# Patient Record
Sex: Female | Born: 1950
Health system: Southern US, Community
[De-identification: ages and names within clinical notes are randomized; demographics above are authoritative.]

## PROBLEM LIST (undated history)

## (undated) DIAGNOSIS — E039 Hypothyroidism, unspecified: Secondary | ICD-10-CM

## (undated) DIAGNOSIS — M1990 Unspecified osteoarthritis, unspecified site: Secondary | ICD-10-CM

## (undated) DIAGNOSIS — R519 Headache, unspecified: Secondary | ICD-10-CM

## (undated) DIAGNOSIS — R51 Headache: Secondary | ICD-10-CM

## (undated) DIAGNOSIS — E785 Hyperlipidemia, unspecified: Secondary | ICD-10-CM

## (undated) DIAGNOSIS — K5792 Diverticulitis of intestine, part unspecified, without perforation or abscess without bleeding: Secondary | ICD-10-CM

## (undated) DIAGNOSIS — F419 Anxiety disorder, unspecified: Secondary | ICD-10-CM

## (undated) DIAGNOSIS — E079 Disorder of thyroid, unspecified: Secondary | ICD-10-CM

## (undated) HISTORY — DX: Disorder of thyroid, unspecified: E07.9

## (undated) HISTORY — DX: Hyperlipidemia, unspecified: E78.5

## (undated) HISTORY — DX: Diverticulitis of intestine, part unspecified, without perforation or abscess without bleeding: K57.92

## (undated) HISTORY — PX: APPENDECTOMY: SHX54

## (undated) HISTORY — PX: CHOLECYSTECTOMY: SHX55

## (undated) HISTORY — PX: VARICOSE VEIN SURGERY: SHX832

## (undated) HISTORY — PX: TUBAL LIGATION: SHX77

---

## 1998-11-02 ENCOUNTER — Other Ambulatory Visit: Admission: RE | Admit: 1998-11-02 | Discharge: 1998-11-02 | Payer: Self-pay | Admitting: Obstetrics and Gynecology

## 2002-01-28 ENCOUNTER — Other Ambulatory Visit: Admission: RE | Admit: 2002-01-28 | Discharge: 2002-01-28 | Payer: Self-pay | Admitting: Obstetrics and Gynecology

## 2003-02-20 ENCOUNTER — Other Ambulatory Visit: Admission: RE | Admit: 2003-02-20 | Discharge: 2003-02-20 | Payer: Self-pay | Admitting: Obstetrics and Gynecology

## 2004-04-02 ENCOUNTER — Other Ambulatory Visit: Admission: RE | Admit: 2004-04-02 | Discharge: 2004-04-02 | Payer: Self-pay | Admitting: Obstetrics and Gynecology

## 2005-05-12 ENCOUNTER — Other Ambulatory Visit: Admission: RE | Admit: 2005-05-12 | Discharge: 2005-05-12 | Payer: Self-pay | Admitting: Obstetrics and Gynecology

## 2009-02-13 ENCOUNTER — Encounter: Admission: RE | Admit: 2009-02-13 | Discharge: 2009-02-13 | Payer: Self-pay | Admitting: Family Medicine

## 2009-10-23 ENCOUNTER — Encounter: Admission: RE | Admit: 2009-10-23 | Discharge: 2009-10-23 | Payer: Self-pay | Admitting: Obstetrics and Gynecology

## 2010-05-16 ENCOUNTER — Encounter: Payer: Self-pay | Admitting: Obstetrics and Gynecology

## 2012-11-05 ENCOUNTER — Encounter: Payer: Self-pay | Admitting: Internal Medicine

## 2012-12-09 ENCOUNTER — Encounter: Payer: Self-pay | Admitting: Internal Medicine

## 2012-12-27 ENCOUNTER — Encounter: Payer: Self-pay | Admitting: Internal Medicine

## 2013-01-02 ENCOUNTER — Encounter: Payer: Self-pay | Admitting: Internal Medicine

## 2013-02-06 ENCOUNTER — Ambulatory Visit (AMBULATORY_SURGERY_CENTER): Payer: Self-pay | Admitting: *Deleted

## 2013-02-06 VITALS — Ht 61.0 in | Wt 118.0 lb

## 2013-02-06 DIAGNOSIS — Z1211 Encounter for screening for malignant neoplasm of colon: Secondary | ICD-10-CM

## 2013-02-06 MED ORDER — MOVIPREP 100 G PO SOLR: ORAL | Status: DC

## 2013-02-06 NOTE — Progress Notes (Signed)
Patient denies any allergies to eggs or soy. Patient denies any problems with anesthesia.  

## 2013-02-08 ENCOUNTER — Encounter: Payer: Self-pay | Admitting: Internal Medicine

## 2013-03-08 ENCOUNTER — Encounter: Payer: Self-pay | Admitting: Internal Medicine

## 2013-03-08 ENCOUNTER — Ambulatory Visit (AMBULATORY_SURGERY_CENTER): Payer: No Typology Code available for payment source | Admitting: Internal Medicine

## 2013-03-08 VITALS — BP 109/50 | HR 61 | Temp 98.2°F | Resp 17 | Ht 61.0 in | Wt 118.0 lb

## 2013-03-08 DIAGNOSIS — Z1211 Encounter for screening for malignant neoplasm of colon: Secondary | ICD-10-CM

## 2013-03-08 LAB — HM COLONOSCOPY

## 2013-03-08 MED ORDER — SODIUM CHLORIDE 0.9 % IV SOLN: 500.0000 mL | INTRAVENOUS | Status: DC

## 2013-03-08 NOTE — Op Note (Signed)
[facility] [address] [place], [address]   COLONOSCOPY PROCEDURE REPORT  PATIENT: Amanda Solis, Amanda Solis  MR#: [id number] BIRTHDATE: [birth year] , 62  yrs. old GENDER: Female ENDOSCOPIST: Hart Carwin, MD REFERRED IO:NGEXBM Nicholos Johns, M.D., Dr Richardean Chimera PROCEDURE DATE:  03/08/2013 PROCEDURE:   Colonoscopy, screening First Screening Colonoscopy - Avg.  risk and is 50 yrs.  old or older - No.  Prior Negative Screening - Now for repeat screening. 10 or more years since last screening  History of Adenoma - Now for follow-up colonoscopy & has been > or = to 3 yrs.  N/A  Polyps Removed Today? No.  Recommend repeat exam, <10 yrs? No. ASA CLASS:   Class II INDICATIONS:Average risk patient for colon cancer and last colonoscopy November 2004 was normal. MEDICATIONS: MAC sedation, administered by CRNA and propofol (Diprivan) 200mg  IV  DESCRIPTION OF PROCEDURE:   After the risks benefits and alternatives of the procedure were thoroughly explained, informed consent was obtained.  A digital rectal exam revealed no abnormalities of the rectum.   The LB [id number]  endoscope was introduced through the anus and advanced to the cecum, which was identified by both the appendix and ileocecal valve. No adverse events experienced.   The quality of the prep was Prepopik excellent  The instrument was then slowly withdrawn as the colon was fully examined.      COLON FINDINGS: Mild diverticulosis was noted in the sigmoid colon. Retroflexed views revealed no abnormalities. The time to cecum=7 minutes 35 seconds.  Withdrawal time=6 minutes 54 seconds.  The scope was withdrawn and the procedure completed. COMPLICATIONS: There were no complications.  ENDOSCOPIC IMPRESSION: Mild diverticulosis was noted in the sigmoid colon  RECOMMENDATIONS: 1.  High fiber diet 2.   recall colonoscopy in 10 years   eSigned:  Hart Carwin, MD 03/08/2013 11:18 AM   cc:

## 2013-03-08 NOTE — Patient Instructions (Signed)
YOU HAD AN ENDOSCOPIC PROCEDURE TODAY AT THE [facility]: Refer to the procedure report that was given to you for any specific questions about what was found during the examination.  If the procedure report does not answer your questions, please call your gastroenterologist to clarify.  If you requested that your care partner not be given the details of your procedure findings, then the procedure report has been included in a sealed envelope for you to review at your convenience later.  YOU SHOULD EXPECT: Some feelings of bloating in the abdomen. Passage of more gas than usual.  Walking can help get rid of the air that was put into your GI tract during the procedure and reduce the bloating. If you had a lower endoscopy (such as a colonoscopy or flexible sigmoidoscopy) you may notice spotting of blood in your stool or on the toilet paper. If you underwent a bowel prep for your procedure, then you may not have a normal bowel movement for a few days.  DIET: Your first meal following the procedure should be a light meal and then it is ok to progress to your normal diet.  A half-sandwich or bowl of soup is an example of a good first meal.  Heavy or fried foods are harder to digest and may make you feel nauseous or bloated.  Likewise meals heavy in dairy and vegetables can cause extra gas to form and this can also increase the bloating.  Drink plenty of fluids but you should avoid alcoholic beverages for 24 hours.  ACTIVITY: Your care partner should take you home directly after the procedure.  You should plan to take it easy, moving slowly for the rest of the day.  You can resume normal activity the day after the procedure however you should NOT DRIVE or use heavy machinery for 24 hours (because of the sedation medicines used during the test).    SYMPTOMS TO REPORT IMMEDIATELY: A gastroenterologist can be reached at any hour.  During normal business hours, 8:30 AM to 5:00 PM Monday through Friday,  call [phone].  After hours and on weekends, please call the GI answering service at [phone] who will take a message and have the physician on call contact you.   Following lower endoscopy (colonoscopy or flexible sigmoidoscopy):  Excessive amounts of blood in the stool  Significant tenderness or worsening of abdominal pains  Swelling of the abdomen that is new, acute  Fever of 100F or higher    FOLLOW UP: If any biopsies were taken you will be contacted by phone or by letter within the next 1-3 weeks.  Call your gastroenterologist if you have not heard about the biopsies in 3 weeks.  Our staff will call the home number listed on your records the next business day following your procedure to check on you and address any questions or concerns that you may have at that time regarding the information given to you following your procedure. This is a courtesy call and so if there is no answer at the home number and we have not heard from you through the emergency physician on call, we will assume that you have returned to your regular daily activities without incident.  SIGNATURES/CONFIDENTIALITY: You and/or your care partner have signed paperwork which will be entered into your electronic medical record.  These signatures attest to the fact that that the information above on your After Visit Summary has been reviewed and is understood.  Full responsibility of the confidentiality   of this discharge information lies with you and/or your care-partner.  Diverticulosis, high fiber information given.  Next colonoscopy 10 years.

## 2013-03-08 NOTE — Progress Notes (Signed)
Patient did not experience any of the following events: a burn prior to discharge; a fall within the facility; wrong site/side/patient/procedure/implant event; or a hospital transfer or hospital admission upon discharge from the facility. (G8907) Patient did not have preoperative order for IV antibiotic SSI prophylaxis. (G8918)  

## 2013-03-08 NOTE — Progress Notes (Signed)
  [facility] Anesthesia Post-op Note  Patient: Amanda Solis  Procedure(s) Performed: colonoscopy  Patient Location: [facility] - Recovery Area  Anesthesia Type: Deep Sedation/Propofol  Level of Consciousness: awake, oriented and patient cooperative  Airway and Oxygen Therapy: Patient Spontanous Breathing  Post-op Pain: none  Post-op Assessment:  Post-op Vital signs reviewed, Patient's Cardiovascular Status Stable, Respiratory Function Stable, Patent Airway, No signs of Nausea or vomiting and Pain level controlled  Post-op Vital Signs: Reviewed and stable  Complications: No apparent anesthesia complications  [place] Frieden E 11:17 AM

## 2013-03-08 NOTE — Progress Notes (Signed)
Patient denies any allergies to eggs or soy. Patient denies any problems with anesthesia.  

## 2013-03-11 ENCOUNTER — Telehealth: Payer: Self-pay | Admitting: *Deleted

## 2013-03-11 NOTE — Telephone Encounter (Signed)
  Follow up Call-  Call back number 03/08/2013  Post procedure Call Back phone  # [phone]  Permission to leave phone message Yes     Patient questions:  Do you have a fever, pain , or abdominal swelling? no Pain Score  0 *  Have you tolerated food without any problems? yes  Have you been able to return to your normal activities? yes  Do you have any questions about your discharge instructions: Diet   no Medications  no Follow up visit  no  Do you have questions or concerns about your Care? no  Actions: * If pain score is 4 or above: No action needed, pain <4.

## 2013-11-11 ENCOUNTER — Emergency Department (HOSPITAL_COMMUNITY)
Admission: EM | Admit: 2013-11-11 | Discharge: 2013-11-12 | Disposition: A | Discharge status: Home / Self Care | Payer: No Typology Code available for payment source | Attending: Emergency Medicine | Admitting: Emergency Medicine

## 2013-11-11 ENCOUNTER — Other Ambulatory Visit: Payer: Self-pay | Admitting: Family Medicine

## 2013-11-11 ENCOUNTER — Ambulatory Visit
Admission: RE | Admit: 2013-11-11 | Discharge: 2013-11-11 | Disposition: A | Discharge status: Home / Self Care | Payer: 59 | Source: Ambulatory Visit | Attending: Family Medicine | Admitting: Family Medicine

## 2013-11-11 ENCOUNTER — Encounter (HOSPITAL_COMMUNITY): Payer: Self-pay | Admitting: Emergency Medicine

## 2013-11-11 DIAGNOSIS — R55 Syncope and collapse: Secondary | ICD-10-CM

## 2013-11-11 DIAGNOSIS — K5730 Diverticulosis of large intestine without perforation or abscess without bleeding: Secondary | ICD-10-CM | POA: Insufficient documentation

## 2013-11-11 DIAGNOSIS — Z9851 Tubal ligation status: Secondary | ICD-10-CM | POA: Insufficient documentation

## 2013-11-11 DIAGNOSIS — R109 Unspecified abdominal pain: Secondary | ICD-10-CM

## 2013-11-11 DIAGNOSIS — Z79899 Other long term (current) drug therapy: Secondary | ICD-10-CM | POA: Insufficient documentation

## 2013-11-11 DIAGNOSIS — K529 Noninfective gastroenteritis and colitis, unspecified: Secondary | ICD-10-CM

## 2013-11-11 DIAGNOSIS — K5289 Other specified noninfective gastroenteritis and colitis: Secondary | ICD-10-CM | POA: Insufficient documentation

## 2013-11-11 DIAGNOSIS — E785 Hyperlipidemia, unspecified: Secondary | ICD-10-CM | POA: Insufficient documentation

## 2013-11-11 DIAGNOSIS — E079 Disorder of thyroid, unspecified: Secondary | ICD-10-CM | POA: Insufficient documentation

## 2013-11-11 DIAGNOSIS — R1032 Left lower quadrant pain: Secondary | ICD-10-CM | POA: Insufficient documentation

## 2013-11-11 DIAGNOSIS — Z9089 Acquired absence of other organs: Secondary | ICD-10-CM | POA: Insufficient documentation

## 2013-11-11 LAB — CBC WITH DIFFERENTIAL/PLATELET
BASOS PCT: 0 % (ref 0–1)
Basophils Absolute: 0 10*3/uL (ref 0.0–0.1)
Eosinophils Absolute: 0 10*3/uL (ref 0.0–0.7)
Eosinophils Relative: 0 % (ref 0–5)
HEMATOCRIT: 36.8 % (ref 36.0–46.0)
HEMOGLOBIN: 12 g/dL (ref 12.0–15.0)
LYMPHS PCT: 11 % — AB (ref 12–46)
Lymphs Abs: 1.3 10*3/uL (ref 0.7–4.0)
MCH: 29.6 pg (ref 26.0–34.0)
MCHC: 32.6 g/dL (ref 30.0–36.0)
MCV: 90.6 fL (ref 78.0–100.0)
MONO ABS: 0.4 10*3/uL (ref 0.1–1.0)
MONOS PCT: 3 % (ref 3–12)
NEUTROS PCT: 86 % — AB (ref 43–77)
Neutro Abs: 9.9 10*3/uL — ABNORMAL HIGH (ref 1.7–7.7)
Platelets: 297 10*3/uL (ref 150–400)
RBC: 4.06 MIL/uL (ref 3.87–5.11)
RDW: 13.9 % (ref 11.5–15.5)
WBC: 11.6 10*3/uL — AB (ref 4.0–10.5)

## 2013-11-11 LAB — COMPREHENSIVE METABOLIC PANEL
ALBUMIN: 4.2 g/dL (ref 3.5–5.2)
ALK PHOS: 50 U/L (ref 39–117)
ALT: 24 U/L (ref 0–35)
ANION GAP: 18 — AB (ref 5–15)
AST: 28 U/L (ref 0–37)
BUN: 11 mg/dL (ref 6–23)
CHLORIDE: 91 meq/L — AB (ref 96–112)
CO2: 21 mEq/L (ref 19–32)
Calcium: 8.9 mg/dL (ref 8.4–10.5)
Creatinine, Ser: 0.76 mg/dL (ref 0.50–1.10)
GFR, EST NON AFRICAN AMERICAN: 89 mL/min — AB (ref >90)
Glucose, Bld: 117 mg/dL — ABNORMAL HIGH (ref 70–99)
Potassium: 3.8 mEq/L (ref 3.7–5.3)
Sodium: 130 mEq/L — ABNORMAL LOW (ref 137–147)
Total Bilirubin: 1.1 mg/dL (ref 0.3–1.2)
Total Protein: 7.3 g/dL (ref 6.0–8.3)

## 2013-11-11 LAB — URINALYSIS, ROUTINE W REFLEX MICROSCOPIC
BILIRUBIN URINE: NEGATIVE
GLUCOSE, UA: NEGATIVE mg/dL
KETONES UR: NEGATIVE mg/dL
Nitrite: NEGATIVE
PH: 6 (ref 5.0–8.0)
Protein, ur: NEGATIVE mg/dL
Specific Gravity, Urine: 1.025 (ref 1.005–1.030)
Urobilinogen, UA: 0.2 mg/dL (ref 0.0–1.0)

## 2013-11-11 LAB — URINE MICROSCOPIC-ADD ON

## 2013-11-11 LAB — LIPASE, BLOOD: Lipase: 13 U/L (ref 11–59)

## 2013-11-11 MED ORDER — ONDANSETRON HCL 4 MG/2ML IJ SOLN
4.0000 mg | Freq: Once | INTRAMUSCULAR | Status: AC
  Administered 2013-11-11: 4 mg via INTRAVENOUS
  Filled 2013-11-11: qty 2

## 2013-11-11 MED ORDER — CIPROFLOXACIN IN D5W 400 MG/200ML IV SOLN
400.0000 mg | Freq: Once | INTRAVENOUS | Status: AC
  Administered 2013-11-11: 400 mg via INTRAVENOUS
  Filled 2013-11-11: qty 200

## 2013-11-11 MED ORDER — METRONIDAZOLE 500 MG PO TABS
500.0000 mg | ORAL_TABLET | Freq: Once | ORAL | Status: AC
  Administered 2013-11-11: 500 mg via ORAL
  Filled 2013-11-11: qty 1

## 2013-11-11 MED ORDER — IOHEXOL 300 MG/ML  SOLN
100.0000 mL | Freq: Once | INTRAMUSCULAR | Status: AC | PRN
  Administered 2013-11-11: 100 mL via INTRAVENOUS

## 2013-11-11 MED ORDER — HYDROMORPHONE HCL PF 1 MG/ML IJ SOLN
0.5000 mg | Freq: Once | INTRAMUSCULAR | Status: AC
  Administered 2013-11-11: 0.5 mg via INTRAVENOUS
  Filled 2013-11-11: qty 1

## 2013-11-11 MED ORDER — SODIUM CHLORIDE 0.9 % IV BOLUS (SEPSIS)
1000.0000 mL | Freq: Once | INTRAVENOUS | Status: AC
Start: 2013-11-11 — End: 2013-11-12
  Administered 2013-11-11: 1000 mL via INTRAVENOUS

## 2013-11-11 NOTE — ED Notes (Signed)
Pt c/o lower abdominal pain starting this morning. Pt was seen by PCP, had CT scan performed, PCP informed pt she may be diverticulitis or colitis. Pt reports no n/v but a little diarrhea

## 2013-11-11 NOTE — ED Provider Notes (Signed)
CSN: [id number]     Arrival date & time 11/11/13  1905 History   First MD Initiated Contact with Patient 11/11/13 2300     Chief Complaint  Patient presents with  . Abdominal Pain     (Consider location/radiation/quality/duration/timing/severity/associated sxs/prior Treatment) HPI Comments: 63 year old female, history of cholecystitis status post cholecystectomy and appendectomy years ago who presents with one day of abdominal pain mostly in the left lower quadrant, associated with 2 episodes of watery stools, mild nausea but no vomiting. She has not had any fevers, has tried Naprosyn for her symptoms without relief. She went to her family doctor, a CT scan was ordered which showed a sending and transverse likely colitis with some sigmoid diverticulitis which was classified as mild. She was referred to the hospital for fluids antibiotics and pain control.  Patient is a 63 y.o. female presenting with abdominal pain. The history is provided by the patient.  Abdominal Pain   Past Medical History  Diagnosis Date  . Thyroid disease   . Hyperlipidemia    Past Surgical History  Procedure Laterality Date  . Tubal ligation    . Cholecystectomy    . Varicose vein surgery     Family History  Problem Relation Age of Onset  . Prostate cancer Father   . Breast cancer Sister   . Colon cancer Neg Hx    History  Substance Use Topics  . Smoking status: Never Smoker   . Smokeless tobacco: Never Used  . Alcohol Use: Yes     Comment: may monthly,1 glass wine occasionally   OB History   Grav Para Term Preterm Abortions TAB SAB Ect Mult Living                 Review of Systems  Gastrointestinal: Positive for abdominal pain.  All other systems reviewed and are negative.     Allergies  Review of patient's allergies indicates no known allergies.  Home Medications   Prior to Admission medications   Medication Sig Start Date End Date Taking? Authorizing Provider  Calcium Carbonate  Antacid (TUMS E-X PO) Take 2.5 tablets by mouth daily.   Yes Historical Provider, MD  Cholecalciferol (VITAMIN D3) 2000 UNITS TABS Take 1 tablet by mouth daily.   Yes Historical Provider, MD  levothyroxine (SYNTHROID, LEVOTHROID) 112 MCG tablet Take 112 mcg by mouth daily before breakfast.   Yes Historical Provider, MD  simvastatin (ZOCOR) 20 MG tablet Take 20 mg by mouth daily.   Yes Historical Provider, MD  ciprofloxacin (CIPRO) 500 MG tablet Take 1 tablet (500 mg total) by mouth every 12 (twelve) hours. 11/12/13   Vida RollerBrian D Erna Brossard, MD  metroNIDAZOLE (FLAGYL) 500 MG tablet Take 1 tablet (500 mg total) by mouth 2 (two) times daily. 11/12/13   Vida RollerBrian D Mikaya Bunner, MD  ondansetron (ZOFRAN ODT) 4 MG disintegrating tablet Take 1 tablet (4 mg total) by mouth every 8 (eight) hours as needed for nausea. 11/12/13   Vida RollerBrian D Paolina Karwowski, MD  oxyCODONE-acetaminophen (PERCOCET) 5-325 MG per tablet Take 1 tablet by mouth every 4 (four) hours as needed. 11/12/13   Vida RollerBrian D Leslie Langille, MD   BP 106/54  Pulse 66  Temp(Src) 98.5 F (36.9 C) (Oral)  Resp 21  Ht 5\' 1"  (1.549 m)  Wt 123 lb (55.792 kg)  BMI 23.25 kg/m2  SpO2 100% Physical Exam  Nursing note and vitals reviewed. Constitutional: She appears well-developed and well-nourished. No distress.  HENT:  Head: Normocephalic and atraumatic.  Mouth/Throat: Oropharynx is clear  and moist. No oropharyngeal exudate.  Eyes: Conjunctivae and EOM are normal. Pupils are equal, round, and reactive to light. Right eye exhibits no discharge. Left eye exhibits no discharge. No scleral icterus.  Neck: Normal range of motion. Neck supple. No JVD present. No thyromegaly present.  Cardiovascular: Normal rate, regular rhythm, normal heart sounds and intact distal pulses.  Exam reveals no gallop and no friction rub.   No murmur heard. Pulmonary/Chest: Effort normal and breath sounds normal. No respiratory distress. She has no wheezes. She has no rales.  Abdominal: Soft. Bowel sounds are  normal. She exhibits no distension and no mass. There is tenderness ( Left lower quadrant, suprapubic and left mid abdominal tenderness to palpation without guarding or peritoneal signs).  Musculoskeletal: Normal range of motion. She exhibits no edema and no tenderness.  Lymphadenopathy:    She has no cervical adenopathy.  Neurological: She is alert. Coordination normal.  Skin: Skin is warm and dry. No rash noted. No erythema.  Psychiatric: She has a normal mood and affect. Her behavior is normal.    ED Course  Procedures (including critical care time) Labs Review Labs Reviewed  CBC WITH DIFFERENTIAL - Abnormal; Notable for the following:    WBC 11.6 (*)    Neutrophils Relative % 86 (*)    Neutro Abs 9.9 (*)    Lymphocytes Relative 11 (*)    All other components within normal limits  COMPREHENSIVE METABOLIC PANEL - Abnormal; Notable for the following:    Sodium 130 (*)    Chloride 91 (*)    Glucose, Bld 117 (*)    GFR calc non Af Amer 89 (*)    Anion gap 18 (*)    All other components within normal limits  URINALYSIS, ROUTINE W REFLEX MICROSCOPIC - Abnormal; Notable for the following:    Hgb urine dipstick SMALL (*)    Leukocytes, UA SMALL (*)    All other components within normal limits  LIPASE, BLOOD  URINE MICROSCOPIC-ADD ON    Imaging Review Ct Abdomen Pelvis W Contrast  11/11/2013   CLINICAL DATA:  Severe pelvic pain this morning, concern for ruptured aortic aneurysm or diverticulitis, patient describes mild diarrhea, with history of cholecystectomy and appendectomy  EXAM: CT ABDOMEN AND PELVIS WITH CONTRAST  TECHNIQUE: Multidetector CT imaging of the abdomen and pelvis was performed using the standard protocol following bolus administration of intravenous contrast.  BUN and creatinine were obtained on site at [facility] at  [address]  Results: BUN 16 mg/dL, Creatinine 0.8 mg/dL.  CONTRAST:  OMNIPAQUE IOHEXOL 300 MG/ML  SOLN  COMPARISON:  None.   FINDINGS: The visualized portions of the lung bases are clear. There are no acute musculoskeletal findings. There is moderate convex left scoliosis of the lumbar spine and there is degenerative disc disease throughout the lumbar spine.  Throughout the liver there are multiple sub cm low-attenuation lesions. There are approximately 5 of these scattered throughout the right left lobe in all these measure less than or equal to 5 mm. Those other visualized on the delayed images do not appear to enhance. These are all too small characterize; they may be cysts, but could potentially be better evaluated with hepatic MRI.  The spleen is normal. There is a tiny focus of invagination of fatty tissue into the pancreatic tail. The pancreas is otherwise normal.  The adrenal glands are normal. The gallbladder is surgically absent. Mild prominence of the common bile duct is likely related to status  post cholecystectomy. The right kidney is normal. The left kidney demonstrates an approximately 9 mm low-attenuation lesion in the posterior cortex of the midpole. It does not appear to enhance. There is a 5 mm left upper pole low-attenuation lesion that also does not appear to enhance but is too small to characterize as well.  There is mild calcification of the abdominal aorta. There is no dilatation or dissection. There is no free fluid or retroperitoneal hematoma there is no significant adenopathy.  Although the cecum appears normal, just beyond the cecum, that ascending colon is relatively narrowed with moderate wall thickening. This appearance continues through the hepatic flexure an involves the proximal half of the transverse colon, beyond which normal caliber and appearance resumes. There is mild diverticulosis of the distal descending and proximal sigmoid colon with very mild hazy attenuation in the surrounding fat at the junction of the descending and sigmoid colon in the region of the left adnexa. This may indicate mild  inflammation.  Reproductive organs and bladder appear normal.  IMPRESSION: 1. Mild diverticulosis distal descending and proximal sigmoid colon with suggestion of very mild surrounding inflammatory change. This could represent mild acute diverticulitis.  2. Abnormal colonic narrowing and wall thickening involving the ascending and proximal half of transverse colon. Consider possibilities such as inflammatory bowel disease. Infectious colitis or lymphoma are other considerations.  3.  Other nonacute findings as described above.   Electronically Signed   By: Esperanza Heir M.D.   On: 11/11/2013 15:59     MDM   Final diagnoses:  Abdominal pain, unspecified abdominal location  Colitis  Diverticulosis of large intestine without hemorrhage    Overall the patient is a well-appearing, the vital signs are normal, her laboratory workup suggests a mild leukocytosis, CT scan reviewed and a cough given to the patient to review the findings and possible differential considerations of her second bowel. Medications and IV fluids ordered as below  The CT scan findings and laboratory work discussed with the family and the patient, symptomatic control was achieved with IV antibiotics, IV pain medications and antiemetics with IV fluids. She states that her pain level is 1/10 at discharge and has tolerated oral fluids. She is in agreement with being discharged and agreed to come back to the hospital if her symptoms worsen. Prescriptions as below  Meds given in ED:  Medications  HYDROmorphone (DILAUDID) injection 0.5 mg (0.5 mg Intravenous Given 11/11/13 2342)  ondansetron (ZOFRAN) injection 4 mg (4 mg Intravenous Given 11/11/13 2342)  sodium chloride 0.9 % bolus 1,000 mL (1,000 mLs Intravenous New Bag/Given 11/11/13 2344)  ciprofloxacin (CIPRO) IVPB 400 mg (0 mg Intravenous Stopped 11/12/13 0044)  metroNIDAZOLE (FLAGYL) tablet 500 mg (500 mg Oral Given 11/11/13 2343)  fentaNYL (SUBLIMAZE) injection 100 mcg (100 mcg  Intravenous Given 11/12/13 0052)    New Prescriptions   CIPROFLOXACIN (CIPRO) 500 MG TABLET    Take 1 tablet (500 mg total) by mouth every 12 (twelve) hours.   METRONIDAZOLE (FLAGYL) 500 MG TABLET    Take 1 tablet (500 mg total) by mouth 2 (two) times daily.   ONDANSETRON (ZOFRAN ODT) 4 MG DISINTEGRATING TABLET    Take 1 tablet (4 mg total) by mouth every 8 (eight) hours as needed for nausea.   OXYCODONE-ACETAMINOPHEN (PERCOCET) 5-325 MG PER TABLET    Take 1 tablet by mouth every 4 (four) hours as needed.        Vida Roller, MD 11/12/13 [phone]

## 2013-11-12 MED ORDER — FENTANYL CITRATE 0.05 MG/ML IJ SOLN
100.0000 ug | Freq: Once | INTRAMUSCULAR | Status: AC
  Administered 2013-11-12: 100 ug via INTRAVENOUS
  Filled 2013-11-12: qty 2

## 2013-11-12 MED ORDER — METRONIDAZOLE 500 MG PO TABS: 500.0000 mg | ORAL_TABLET | Freq: Two times a day (BID) | ORAL | Status: DC

## 2013-11-12 MED ORDER — OXYCODONE-ACETAMINOPHEN 5-325 MG PO TABS: 1.0000 | ORAL_TABLET | ORAL | Status: DC | PRN

## 2013-11-12 MED ORDER — ONDANSETRON 4 MG PO TBDP: 4.0000 mg | ORAL_TABLET | Freq: Three times a day (TID) | ORAL | Status: DC | PRN

## 2013-11-12 MED ORDER — CIPROFLOXACIN HCL 500 MG PO TABS: 500.0000 mg | ORAL_TABLET | Freq: Two times a day (BID) | ORAL | Status: DC

## 2013-11-12 NOTE — Discharge Instructions (Signed)
Please call your doctor for a followup appointment within 24-48 hours. When you talk to your doctor please let them know that you were seen in the emergency department and have them acquire all of your records so that they can discuss the findings with you and formulate a treatment plan to fully care for your new and ongoing problems. ° °

## 2013-11-15 ENCOUNTER — Other Ambulatory Visit: Payer: Self-pay | Admitting: Family Medicine

## 2013-11-15 DIAGNOSIS — R9389 Abnormal findings on diagnostic imaging of other specified body structures: Secondary | ICD-10-CM

## 2014-01-13 ENCOUNTER — Other Ambulatory Visit: Payer: No Typology Code available for payment source

## 2014-01-14 ENCOUNTER — Ambulatory Visit
Admission: RE | Admit: 2014-01-14 | Discharge: 2014-01-14 | Disposition: A | Discharge status: Home / Self Care | Payer: 59 | Source: Ambulatory Visit | Attending: Family Medicine | Admitting: Family Medicine

## 2014-01-14 DIAGNOSIS — R9389 Abnormal findings on diagnostic imaging of other specified body structures: Secondary | ICD-10-CM

## 2014-01-14 MED ORDER — IOHEXOL 300 MG/ML  SOLN
100.0000 mL | Freq: Once | INTRAMUSCULAR | Status: AC | PRN
  Administered 2014-01-14: 100 mL via INTRAVENOUS

## 2014-11-27 LAB — HM PAP SMEAR: HM Pap smear: NEGATIVE

## 2015-03-06 ENCOUNTER — Ambulatory Visit (INDEPENDENT_AMBULATORY_CARE_PROVIDER_SITE_OTHER): Payer: PRIVATE HEALTH INSURANCE | Admitting: Physician Assistant

## 2015-03-06 ENCOUNTER — Encounter: Payer: Self-pay | Admitting: Physician Assistant

## 2015-03-06 VITALS — Ht 61.0 in | Wt 117.0 lb

## 2015-03-06 DIAGNOSIS — K5732 Diverticulitis of large intestine without perforation or abscess without bleeding: Secondary | ICD-10-CM

## 2015-03-06 MED ORDER — DICYCLOMINE HCL 10 MG PO CAPS: 10.0000 mg | ORAL_CAPSULE | Freq: Three times a day (TID) | ORAL | Status: DC

## 2015-03-06 MED ORDER — CIPROFLOXACIN HCL 500 MG PO TABS: 500.0000 mg | ORAL_TABLET | Freq: Two times a day (BID) | ORAL | Status: DC

## 2015-03-06 NOTE — Progress Notes (Signed)
Patient ID: Amanda Solis, female   DOB: 04/07/1951, 64 y.o.   MRN: [id number]   Subjective:    Patient ID: Amanda BromeBrenda P Solis, female    DOB: 06/19/1950, 64 y.o.   MRN: [id number]  HPI  Amanda Solis  is a pleasant 64 year old white female known to Dr. Lina Sarora Brodie previously who had undergone colonoscopy in November 2014 with finding of mild sigmoid diverticulosis and otherwise normal exam. Patient states that she has had 2 prior episodes of diverticulitis the last about a year and half ago. She comes in today after [place] and she developed persistent left lower quadrant pain she described as a severe constant aching that was present for 2-3 days total and "made me miserable". She does not have any documented fever though felt sick and may have had some chills, no urinary symptoms. She has been having bowel movements but has noticed some mucus with her stools, no blood. She says she is still uncomfortable and feels as if she's had another episode of diverticulitis. Pain is not as bad as it was last week but persists.  Review of Systems Pertinent positive and negative review of systems were noted in the above HPI section.  All other review of systems was otherwise negative.  Outpatient Encounter Prescriptions as of 03/06/2015  Medication Sig  . Calcium Carbonate Antacid (TUMS E-X PO) Take 2.5 tablets by mouth daily.  . Cholecalciferol (VITAMIN D3) 2000 UNITS TABS Take 1 tablet by mouth daily.  Marland Kitchen. levothyroxine (SYNTHROID, LEVOTHROID) 112 MCG tablet Take 112 mcg by mouth daily before breakfast.  . simvastatin (ZOCOR) 20 MG tablet Take 20 mg by mouth daily.  . ciprofloxacin (CIPRO) 500 MG tablet Take 1 tablet (500 mg total) by mouth 2 (two) times daily.  Marland Kitchen. dicyclomine (BENTYL) 10 MG capsule Take 1 capsule (10 mg total) by mouth 3 (three) times daily before meals.  . [DISCONTINUED] ciprofloxacin (CIPRO) 500 MG tablet Take 1 tablet (500 mg total) by mouth every 12 (twelve)  hours. (Patient not taking: Reported on 03/06/2015)  . [DISCONTINUED] metroNIDAZOLE (FLAGYL) 500 MG tablet Take 1 tablet (500 mg total) by mouth 2 (two) times daily. (Patient not taking: Reported on 03/06/2015)  . [DISCONTINUED] ondansetron (ZOFRAN ODT) 4 MG disintegrating tablet Take 1 tablet (4 mg total) by mouth every 8 (eight) hours as needed for nausea. (Patient not taking: Reported on 03/06/2015)  . [DISCONTINUED] oxyCODONE-acetaminophen (PERCOCET) 5-325 MG per tablet Take 1 tablet by mouth every 4 (four) hours as needed. (Patient not taking: Reported on 03/06/2015)   No facility-administered encounter medications on file as of 03/06/2015.   No Known Allergies Patient Active Problem List   Diagnosis Date Noted  . Diverticulitis of colon without hemorrhage 03/06/2015   Social History   Social History  . Marital Status: Single    Spouse Name: N/A  . Number of Children: N/A  . Years of Education: N/A   Occupational History  . Not on file.   Social History Main Topics  . Smoking status: Never Smoker   . Smokeless tobacco: Never Used  . Alcohol Use: Yes     Comment: may monthly,1 glass wine occasionally  . Drug Use: No  . Sexual Activity: Not on file   Other Topics Concern  . Not on file   Social History Narrative    Amanda Solis's family history includes Breast cancer in her sister; Prostate cancer in her father. There is no history of Colon cancer.  Objective:    There were no vitals filed for this visit.  Physical Exam  well-developed older white female in no acute distress, by her husband height 5 foot 1 weight 117. HEENT ;nontraumatic normocephalic EOMI PERRLA sclera anicteric, Cardiovascular; regular rate and rhythm with S1-S2 no murmur or gallop, Pulmonary; clear bilaterally, Abdomen; soft, bowel sounds are present she has mild tenderness in the left lower quadrant there is no guarding or rebound no palpable mass or hepatosplenomegaly, Rectal ;exam not  done, Extremities; no clubbing cyanosis or edema skin warm and dry, Neuropsych ;mood and affect appropriate       Assessment & Plan:   #1 64 yo female with mild  acute diverticulitis /sigmoid-  2 previous episodes in past couple years  Plan;  Start Cipro 500 mg po BID x 10 days Bentyl 10 mg po TID prn for cramping/pain Start daily probiotic after completing antibiotics  High fiber diet with avoidance of nuts and popcorn  Pt will be established with Dr Lavon Paganini  She will call if sxs have not completely resolved when finishes abx  Sammuel Cooper PA-C 03/06/2015   Cc: Elias Else, MD

## 2015-03-06 NOTE — Patient Instructions (Signed)
We have sent the following medications to your pharmacy for you to pick up at your convenience: Cipro 500 mg twice a day Bentyl 10 mg three times a day as needed for cramping  Start daily probiotic such as Align, Cultrelle.  Call if you have any problems.   Please follow up with Dr. Lavon PaganiniNandigam to establish care.

## 2015-03-06 NOTE — Progress Notes (Signed)
Reviewed and agree with documentation and assessment and plan. K. Veena Nandigam , MD   

## 2015-04-03 ENCOUNTER — Telehealth: Payer: Self-pay | Admitting: Physician Assistant

## 2015-04-03 NOTE — Telephone Encounter (Signed)
Patient is feeling well. No abdominal pain. No pain with bowel movements. She has noticed an increase in the number of bowel movements she has each day. She is on [employer]. She will continue as she is doing. Call back if she develops pain or notes blood with bowel movements or abdominal pain.

## 2015-04-03 NOTE — Telephone Encounter (Signed)
I have left message for the patient to call back 

## 2015-05-08 ENCOUNTER — Ambulatory Visit (INDEPENDENT_AMBULATORY_CARE_PROVIDER_SITE_OTHER): Payer: 59 | Admitting: Gastroenterology

## 2015-05-08 ENCOUNTER — Encounter: Payer: Self-pay | Admitting: Gastroenterology

## 2015-05-08 VITALS — BP 90/62 | HR 60 | Ht 61.0 in | Wt 117.0 lb

## 2015-05-08 DIAGNOSIS — Z8719 Personal history of other diseases of the digestive system: Secondary | ICD-10-CM

## 2015-05-08 DIAGNOSIS — K5730 Diverticulosis of large intestine without perforation or abscess without bleeding: Secondary | ICD-10-CM

## 2015-05-08 NOTE — Patient Instructions (Signed)
Follow up with Dr Lavon PaganiniNandigam in 1 year. Sooner if needed.

## 2015-05-08 NOTE — Progress Notes (Signed)
Amanda Solis    [id number]    11/24/1950  Primary Care Physician:READE,ROBERT Lyn HollingsheadALEXANDER, MD  Referring Physician: Elias Elseobert Reade, MD [address] [place], [place] [id number]  Chief complaint:  Diverticulosis  HPI:  65 year old white female known to Dr. Lina Sarora Brodie previously who had undergone colonoscopy in November 2014 with finding of mild sigmoid diverticulosis and otherwise normal exam. Patient  had 2 prior episodes of diverticulitis the last one in November 2016 was empirically treated with Cipro and Flagyl and prior to that she had in July 2015 mild diverticulitis. She feels well overall. She is taking probiotic and feels that is helping her symptoms. Denies any constipation, diarrhea, abdominal pain, nausea or vomiting.  Outpatient Encounter Prescriptions as of 05/08/2015  Medication Sig  . Calcium Carbonate Antacid (TUMS E-X PO) Take 2.5 tablets by mouth daily.  . Cholecalciferol (VITAMIN D3) 2000 UNITS TABS Take 1 tablet by mouth daily.  Marland Kitchen. levothyroxine (SYNTHROID, LEVOTHROID) 112 MCG tablet Take 112 mcg by mouth daily before breakfast.  . simvastatin (ZOCOR) 20 MG tablet Take 20 mg by mouth daily.  . [DISCONTINUED] ciprofloxacin (CIPRO) 500 MG tablet Take 1 tablet (500 mg total) by mouth 2 (two) times daily.  . [DISCONTINUED] dicyclomine (BENTYL) 10 MG capsule Take 1 capsule (10 mg total) by mouth 3 (three) times daily before meals.   No facility-administered encounter medications on file as of 05/08/2015.    Allergies as of 05/08/2015  . (No Known Allergies)    Past Medical History  Diagnosis Date  . Thyroid disease   . Hyperlipidemia     Past Surgical History  Procedure Laterality Date  . Tubal ligation    . Cholecystectomy    . Varicose vein surgery      Family History  Problem Relation Age of Onset  . Prostate cancer Father   . Breast cancer Sister   . Colon cancer Neg Hx     Social History   Social History  . Marital  Status: Single    Spouse Name: N/A  . Number of Children: N/A  . Years of Education: N/A   Occupational History  . Not on file.   Social History Main Topics  . Smoking status: Never Smoker   . Smokeless tobacco: Never Used  . Alcohol Use: 0.0 oz/week    0 Standard drinks or equivalent per week     Comment: may monthly,1 glass wine occasionally  . Drug Use: No  . Sexual Activity: Not on file   Other Topics Concern  . Not on file   Social History Narrative      Review of systems: Review of Systems  Constitutional: Negative for fever and chills.  HENT: Negative.   Eyes: Negative for blurred vision.  Respiratory: Negative for cough, shortness of breath and wheezing.   Cardiovascular: Negative for chest pain and palpitations.  Gastrointestinal: as per HPI Genitourinary: Negative for dysuria, urgency, frequency and hematuria.  Musculoskeletal: Negative for myalgias, back pain and joint pain.  Skin: Negative for itching and rash.  Neurological: Negative for dizziness, tremors, focal weakness, seizures and loss of consciousness.  Endo/Heme/Allergies: Negative for environmental allergies.  Psychiatric/Behavioral: Negative for depression, suicidal ideas and hallucinations.  All other systems reviewed and are negative.   Physical Exam: Filed Vitals:   05/08/15 0853  BP: 90/62  Pulse: 60   Gen:      No acute distress HEENT:  EOMI, sclera anicteric Neck:  No masses; no thyromegaly Lungs:    Clear to auscultation bilaterally; normal respiratory effort CV:         Regular rate and rhythm; no murmurs Abd:      + bowel sounds; soft, non-tender; no palpable masses, no distension Ext:    No edema; adequate peripheral perfusion Skin:      Warm and dry; no rash Neuro: alert and oriented x 3 Psych: normal mood and affect  Data Reviewed: CT abdomen and pelvis July 2015 1. Mild diverticulosis distal descending and proximal sigmoid colon with suggestion of very mild  surrounding inflammatory change. This could represent mild acute diverticulitis.  2. Abnormal colonic narrowing and wall thickening involving the ascending and proximal half of transverse colon. Consider possibilities such as inflammatory bowel disease. Infectious colitis or lymphoma are other considerations.  3. Other nonacute findings as described above.   Assessment and Plan/Recommendations:  65 year old female with history of sigmoid diverticulosis status post 2-3 episodes of mild diverticulitis treated with oral antibiotics here for follow-up visit She had 2 colonoscopies in 2004 and 2014 both showed sigmoid diverticulosis with no polyps Okay to continue probiotic as needed Advised patient to avoid excessive fiber Return in 1 year or sooner if needed  K. Scherry Ran , MD [phone] Mon-Fri 8a-5p [phone] after 5p, weekends, holidays

## 2015-09-16 LAB — HEPATIC FUNCTION PANEL
ALT: 15 U/L (ref 7–35)
AST: 19 U/L (ref 13–35)
Alkaline Phosphatase: 45 U/L (ref 25–125)
Bilirubin, Total: 0.8 mg/dL

## 2015-09-16 LAB — CALCIUM: Calcium: 9.6 mg/dL

## 2015-09-16 LAB — BASIC METABOLIC PANEL
BUN: 15 mg/dL (ref 4–21)
CREATININE: 0.9 mg/dL (ref <=1.1)
Glucose: 102 mg/dL
POTASSIUM: 4.2 mmol/L (ref 3.4–5.3)
SODIUM: 140 mmol/L (ref 137–147)

## 2015-09-16 LAB — LIPID PANEL
CHOLESTEROL: 201 mg/dL — AB (ref 0–200)
HDL: 95 mg/dL — AB (ref 35–70)
LDL Cholesterol: 93 mg/dL
Triglycerides: 61 mg/dL (ref 40–160)

## 2015-09-16 LAB — ESTIMATED GFR: GFR CALC NON AF AMER: 65

## 2015-09-16 LAB — TSH: TSH: 0.93 u[IU]/mL (ref 0.41–5.90)

## 2015-09-16 LAB — CORRECTED CALCIUM (CC13): CORRECTED CALCIUM: 8.99

## 2015-09-16 LAB — CHLORIDE: CHLORIDE: 103 mmol/L

## 2015-09-16 LAB — BILIRUBIN, TOTAL: TOTBILIFLUID: 0.8

## 2016-03-02 ENCOUNTER — Ambulatory Visit (INDEPENDENT_AMBULATORY_CARE_PROVIDER_SITE_OTHER): Payer: 59 | Admitting: Family Medicine

## 2016-03-02 ENCOUNTER — Encounter: Payer: Self-pay | Admitting: Family Medicine

## 2016-03-02 DIAGNOSIS — M8589 Other specified disorders of bone density and structure, multiple sites: Secondary | ICD-10-CM

## 2016-03-02 DIAGNOSIS — G4700 Insomnia, unspecified: Secondary | ICD-10-CM

## 2016-03-02 DIAGNOSIS — Z9049 Acquired absence of other specified parts of digestive tract: Secondary | ICD-10-CM | POA: Insufficient documentation

## 2016-03-02 DIAGNOSIS — Z803 Family history of malignant neoplasm of breast: Secondary | ICD-10-CM

## 2016-03-02 DIAGNOSIS — Z833 Family history of diabetes mellitus: Secondary | ICD-10-CM

## 2016-03-02 DIAGNOSIS — E782 Mixed hyperlipidemia: Secondary | ICD-10-CM

## 2016-03-02 DIAGNOSIS — E039 Hypothyroidism, unspecified: Secondary | ICD-10-CM | POA: Diagnosis not present

## 2016-03-02 DIAGNOSIS — E785 Hyperlipidemia, unspecified: Secondary | ICD-10-CM | POA: Insufficient documentation

## 2016-03-02 DIAGNOSIS — E559 Vitamin D deficiency, unspecified: Secondary | ICD-10-CM | POA: Diagnosis not present

## 2016-03-02 DIAGNOSIS — M8580 Other specified disorders of bone density and structure, unspecified site: Secondary | ICD-10-CM | POA: Insufficient documentation

## 2016-03-02 MED ORDER — ZOLPIDEM TARTRATE ER 6.25 MG PO TBCR: EXTENDED_RELEASE_TABLET | ORAL | 2 refills | Status: DC

## 2016-03-02 NOTE — Patient Instructions (Addendum)

## 2016-03-02 NOTE — Progress Notes (Signed)
New patient office visit note:  Impression and Recommendations:    1. Mixed hyperlipidemia   2. Hypothyroidism, unspecified type   3. Vitamin D deficiency   4. Osteopenia of multiple sites   5. Insomnia, unspecified type   6. Family history of breast cancer in sister   75. Family history of diabetes mellitus in father    Declines bloodwrk b/c recently had it at pcp's office.  She will get me med records near future.   HLD (hyperlipidemia) No issues - takes zocor.  Exercises regularly; eats healthy  Osteopenia On Ca and vit D supp- will check levels  Vitamin D deficiency Will need reck of levels.  Cont supp  Insomnia disorder- chronic Prior has taken tylenol PM.  After R/B meds, we decided on trial of ambien cr.  - sleep hygeine  Hypothyroidism Asx, cont meds.  Will check levels in future    Patient's Medications  New Prescriptions   ZOLPIDEM (AMBIEN CR) 6.25 MG CR TABLET    1-2 tabs q hs  Previous Medications   CALCIUM CARBONATE ANTACID (TUMS E-X PO)    Take 2.5 tablets by mouth daily.   CHOLECALCIFEROL (VITAMIN D3) 2000 UNITS TABS    Take 1 tablet by mouth daily.   SIMVASTATIN (ZOCOR) 20 MG TABLET    Take 20 mg by mouth daily.   SYNTHROID 88 MCG TABLET    Take 1 tablet by mouth daily.  Modified Medications   No medications on file  Discontinued Medications   LEVOTHYROXINE (SYNTHROID, LEVOTHROID) 112 MCG TABLET    Take 112 mcg by mouth daily before breakfast.    Return in about 3 months (around 06/02/2016) for f/up insomnia; .  The patient was counseled, risk factors were discussed, anticipatory guidance given.  Gross side effects, risk and benefits, and alternatives of medications discussed with patient.  Patient is aware that all medications have potential side effects and we are unable to predict every side effect or drug-drug interaction that may occur.  Expresses verbal understanding and consents to current therapy plan and treatment  regimen.  Please see AVS handed out to patient at the end of our visit for further patient instructions/ counseling done pertaining to today's office visit.    Note: This document was prepared using Dragon voice recognition software and may include unintentional dictation errors.  --------------------------------------------------------------------------------------------------------------  Subjective:    Chief Complaint  Patient presents with  . Establish Care    HPI: Amanda Solis is a pleasant 65 y.o. female who presents to [facility] today to review their medical history with me and establish care.   I asked the patient to review their chronic problem list with me to ensure everything was updated and accurate.     [facility]   Married- Amanda Solis;  Retired.   3 kids  10 yrs- chol issues---> drinks all water most all day   Hypothyroidism- asx, tol meds well  bikes about 5 days/ week for 1-1.5 hrs.     Patient Care Team    Relationship Specialty Notifications Start End  Thomasene Lot, DO PCP - General Family Medicine  03/02/16   Iva Boop, MD Consulting Physician Gastroenterology  03/02/16   Richardean Chimera, MD Consulting Physician Obstetrics and Gynecology  03/02/16      Wt Readings from Last 3 Encounters:  03/02/16 120 lb 3.2 oz (54.5 kg)  05/08/15 117 lb (53.1 kg)  03/06/15 117 lb 0.8 oz (53.1 kg)   BP Readings from Last 3 Encounters:  03/02/16 116/72  05/08/15 90/62  11/12/13 (!) 109/46   Pulse Readings from Last 3 Encounters:  03/02/16 90  05/08/15 60  11/12/13 74   BMI Readings from Last 3 Encounters:  03/02/16 22.71 kg/m  05/08/15 22.11 kg/m  03/06/15 22.12 kg/m   No results found for: HGBA1C  Patient Active Problem List   Diagnosis Date Noted  . HLD (hyperlipidemia) 03/02/2016    Priority: High  . Insomnia disorder- chronic 03/02/2016    Priority: High  . Hypothyroidism  03/02/2016    Priority: Medium  . Vitamin D deficiency 03/02/2016    Priority: Medium  . Osteopenia 03/02/2016    Priority: Medium  . S/P cholecystectomy in 1978 03/02/2016    Priority: Low  . h/o Diverticulitis of colon without hemorrhage 2016 03/06/2015    Priority: Low  . Family history of breast cancer in sister 03/05/2016  . Family history of diabetes mellitus in father 03/05/2016     Past Medical History:  Diagnosis Date  . Diverticulitis   . Hyperlipidemia   . Thyroid disease      Past Surgical History:  Procedure Laterality Date  . APPENDECTOMY    . CHOLECYSTECTOMY    . TUBAL LIGATION    . VARICOSE VEIN SURGERY       Family History  Problem Relation Age of Onset  . Diabetes Mother   . Prostate cancer Father   . Alcohol abuse Father   . Breast cancer Sister   . Colon cancer Neg Hx      History  Drug Use No    History  Alcohol Use  . 0.0 oz/week    Comment: may monthly,1 glass wine occasionally    History  Smoking Status  . Former Smoker  . Packs/day: 0.25  . Years: 2.00  . Types: Cigarettes  . Quit date: 04/25/1980  Smokeless Tobacco  . Never Used    Patient's Medications  New Prescriptions   ZOLPIDEM (AMBIEN CR) 6.25 MG CR TABLET    1-2 tabs q hs  Previous Medications   CALCIUM CARBONATE ANTACID (TUMS E-X PO)    Take 2.5 tablets by mouth daily.   CHOLECALCIFEROL (VITAMIN D3) 2000 UNITS TABS    Take 1 tablet by mouth daily.   SIMVASTATIN (ZOCOR) 20 MG TABLET    Take 20 mg by mouth daily.   SYNTHROID 88 MCG TABLET    Take 1 tablet by mouth daily.  Modified Medications   No medications on file  Discontinued Medications   LEVOTHYROXINE (SYNTHROID, LEVOTHROID) 112 MCG TABLET    Take 112 mcg by mouth daily before breakfast.    Allergies: Patient has no known allergies.  Review of Systems  Constitutional: Negative.  Negative for chills, diaphoresis, fever, malaise/fatigue and weight loss.  HENT: Negative.  Negative for congestion, sore  throat and tinnitus.   Eyes: Negative.  Negative for blurred vision, double vision and photophobia.  Respiratory: Negative.  Negative for cough and wheezing.   Cardiovascular: Negative.  Negative for chest pain and palpitations.  Gastrointestinal: Negative.  Negative for blood in stool, diarrhea, nausea and vomiting.  Genitourinary: Negative.  Negative for dysuria, frequency and urgency.  Musculoskeletal: Negative.  Negative for joint pain and myalgias.  Skin: Negative.  Negative for itching and rash.  Neurological: Negative.  Negative for dizziness, focal weakness, weakness and headaches.  Endo/Heme/Allergies: Negative.  Negative for environmental allergies and polydipsia. Does not  bruise/bleed easily.  Psychiatric/Behavioral: Negative for depression and memory loss. The patient has insomnia. The patient is not nervous/anxious.      Objective:   Blood pressure 116/72, pulse 90, height 5\' 1"  (1.549 m), weight 120 lb 3.2 oz (54.5 kg). Body mass index is 22.71 kg/m. General: Well Developed, well nourished, and in no acute distress.  Neuro: Alert and oriented x3, extra-ocular muscles intact, sensation grossly intact.  HEENT: Normocephalic, atraumatic, pupils equal round reactive to light, neck supple Skin: no gross suspicious lesions or rashes  Cardiac: Regular rate and rhythm, no murmurs rubs or gallops.  Respiratory: Essentially clear to auscultation bilaterally. Not using accessory muscles, speaking in full sentences.  Abdominal: Soft, not grossly distended Musculoskeletal: Ambulates w/o diff, FROM * 4 ext.  Vasc: less 2 sec cap RF, warm and pink  Psych:  No HI/SI, judgement and insight good, Euthymic mood. Full Affect.

## 2016-03-02 NOTE — Assessment & Plan Note (Addendum)
No issues - takes zocor.  Exercises regularly; eats healthy

## 2016-03-05 ENCOUNTER — Encounter: Payer: Self-pay | Admitting: Family Medicine

## 2016-03-05 DIAGNOSIS — Z803 Family history of malignant neoplasm of breast: Secondary | ICD-10-CM | POA: Insufficient documentation

## 2016-03-05 DIAGNOSIS — Z833 Family history of diabetes mellitus: Secondary | ICD-10-CM | POA: Insufficient documentation

## 2016-03-05 NOTE — Assessment & Plan Note (Signed)
On Ca and vit D supp- will check levels

## 2016-03-05 NOTE — Assessment & Plan Note (Addendum)
Asx, cont meds.  Will check levels in future

## 2016-03-05 NOTE — Assessment & Plan Note (Signed)
Prior has taken tylenol PM.  After R/B meds, we decided on trial of ambien cr.  - sleep hygeine

## 2016-03-05 NOTE — Assessment & Plan Note (Signed)
Will need reck of levels.  Cont supp

## 2016-06-07 ENCOUNTER — Ambulatory Visit: Payer: 59 | Admitting: Family Medicine

## 2016-07-13 ENCOUNTER — Ambulatory Visit: Payer: 59 | Admitting: Family Medicine

## 2016-08-17 ENCOUNTER — Ambulatory Visit: Payer: 59 | Admitting: Family Medicine

## 2016-09-07 ENCOUNTER — Ambulatory Visit (INDEPENDENT_AMBULATORY_CARE_PROVIDER_SITE_OTHER): Payer: 59 | Admitting: Family Medicine

## 2016-09-07 ENCOUNTER — Encounter: Payer: Self-pay | Admitting: Family Medicine

## 2016-09-07 VITALS — BP 136/70 | HR 76 | Ht 61.0 in | Wt 122.0 lb

## 2016-09-07 DIAGNOSIS — Z833 Family history of diabetes mellitus: Secondary | ICD-10-CM | POA: Diagnosis not present

## 2016-09-07 DIAGNOSIS — Z820 Family history of epilepsy and other diseases of the nervous system: Secondary | ICD-10-CM

## 2016-09-07 DIAGNOSIS — E559 Vitamin D deficiency, unspecified: Secondary | ICD-10-CM

## 2016-09-07 DIAGNOSIS — G4700 Insomnia, unspecified: Secondary | ICD-10-CM | POA: Diagnosis not present

## 2016-09-07 DIAGNOSIS — E039 Hypothyroidism, unspecified: Secondary | ICD-10-CM

## 2016-09-07 MED ORDER — ZOLPIDEM TARTRATE ER 6.25 MG PO TBCR
EXTENDED_RELEASE_TABLET | ORAL | 2 refills | Status: DC
Start: 2016-09-07 — End: 2016-12-01

## 2016-09-07 NOTE — Patient Instructions (Addendum)
-   Please get me results of sleep study  - If you have insomnia or difficulty sleeping, this information is for you:  - Avoid caffeinated beverages after lunch,  no alcoholic beverages,  no eating within 2-3 hours of lying down,  avoid exposure to blue light before bed,  avoid daytime naps, and  needs to maintain a regular sleep schedule- go to sleep and wake up around the same time every night.   - Resolve concerns or worries before entering bedroom:  Discussed relaxation techniques with patient and to keep a journal to write down fears\ worries.  I suggested seeing a counselor for CBT.   - Recommend patient meditate or do deep breathing exercises to help relax.   Incorporate the use of white noise machines or listen to "sleep meditation music", or recordings of guided meditations for sleep from YouTube which are free, such as  "guided meditation for detachment from over thinking"  by Ina KickMichael Sealey.

## 2016-09-07 NOTE — Progress Notes (Signed)
Impression and Recommendations:    1. Insomnia, unspecified type   2. Hypothyroidism, unspecified type   3. Vitamin D deficiency   4. Family history of diabetes mellitus in father   5. Family history of Alzheimer's disease    The patient was counseled, risk factors were discussed, anticipatory guidance given.  -Sleep hygiene discussed in detail. -Patient is due for labs and never had them with [place] as per my recommendations in the past.  Will make appointment in the near future for fasting labs.  We will check vitamin D, TSH, T4, A1c, CMP and CBC. -Memory evaluation today appears to be intact/ WNL's.  Reassured patient.  Please see scanned sheet. -Trial of Ambien CR since she is having difficulty staying asleep. -Obtain sleep study results -asked patient to sign HIPAA forms.   Gross side effects, risk and benefits, and alternatives of medications and treatment plan in general discussed with patient.  Patient is aware that all medications have potential side effects and we are unable to predict every side effect or drug-drug interaction that may occur.   Patient will call with any questions prior to using medication if they have concerns.  Expresses verbal understanding and consents to current therapy and treatment regimen.  No barriers to understanding were identified.  Red flag symptoms and signs discussed in detail.  Patient expressed understanding regarding what to do in case of emergency\urgent symptoms  Please see AVS handed out to patient at the end of our visit for further patient instructions/ counseling done pertaining to today's office visit.   Return in about 6 weeks (around 10/19/2016) for f/up sleep med, inguinal pain, come fasting.     Note: This document was prepared using Dragon voice recognition software and may include unintentional dictation errors.  Cozette Braggs 9:25  AM --------------------------------------------------------------------------------------------------------------------------------------------------------------------------------------------------------------------------------------------    Subjective:    CC:  Chief Complaint  Patient presents with  . Insomnia  . Memory Loss    HPI: Amanda Solis is a 66 y.o. female who presents to [facility] at [facility] today for issues as discussed below.   Sleep difficulties-  Doesn't recall if Remus Loffler worked well or not.  Worried about a lot of things- can't turn off brain.  Had sleep study but I do not have those results.   Memory concerns-  Mom with dementia in 33's, brother in 25's.  Concerns for a couple months. No mood d/o history   Wt Readings from Last 3 Encounters:  01/24/17 125 lb 9.6 oz (57 kg)  12/01/16 123 lb 1.6 oz (55.8 kg)  09/07/16 122 lb (55.3 kg)   BP Readings from Last 3 Encounters:  01/24/17 130/80  12/01/16 126/68  09/07/16 136/70   Pulse Readings from Last 3 Encounters:  01/24/17 65  12/01/16 66  09/07/16 76   BMI Readings from Last 3 Encounters:  01/24/17 23.73 kg/m  12/01/16 23.26 kg/m  09/07/16 23.05 kg/m     Patient Care Team    Relationship Specialty Notifications Start End  Thomasene Lot, DO PCP - General Family Medicine  03/02/16   Iva Boop, MD Consulting Physician Gastroenterology  03/02/16   Richardean Chimera, MD Consulting Physician Obstetrics and Gynecology  03/02/16      Patient Active Problem List   Diagnosis Date Noted  . HLD (hyperlipidemia) 03/02/2016    Priority: High  . Insomnia 03/02/2016    Priority: High  . Hypothyroidism 03/02/2016    Priority: Medium  . Vitamin  D deficiency 03/02/2016    Priority: Medium  . Osteopenia 03/02/2016    Priority: Medium  . S/P cholecystectomy in 1978 03/02/2016    Priority: Low  . h/o Diverticulitis of colon without hemorrhage 2016 03/06/2015    Priority: Low   . Family history of Alzheimer's disease 09/07/2016  . Family history of breast cancer in sister 03/05/2016  . Family history of diabetes mellitus in father 03/05/2016    Past Medical history, Surgical history, Family history, Social history, Allergies and Medications have been entered into the medical record, reviewed and changed as needed.    Current Meds  Medication Sig  . Calcium Carbonate Antacid (TUMS E-X PO) Take 2.5 tablets by mouth daily.  . Cholecalciferol (VITAMIN D3) 2000 UNITS TABS Take 1 tablet by mouth daily.  . [DISCONTINUED] simvastatin (ZOCOR) 20 MG tablet Take 20 mg by mouth daily.  . [DISCONTINUED] SYNTHROID 88 MCG tablet Take 1 tablet by mouth daily.    Allergies:  No Known Allergies   Review of Systems: General:   Denies fever, chills, unexplained weight loss.  Optho/Auditory:   Denies visual changes, blurred vision/LOV Respiratory:   Denies wheeze, DOE more than baseline levels.  Cardiovascular:   Denies chest pain, palpitations, new onset peripheral edema  Gastrointestinal:   Denies nausea, vomiting, diarrhea, abd pain.  Genitourinary: Denies dysuria, freq/ urgency, flank pain or discharge from genitals.  Endocrine:     Denies hot or cold intolerance, polyuria, polydipsia. Musculoskeletal:   Denies unexplained myalgias, joint swelling, unexplained arthralgias, gait problems.  Skin:  Denies new onset rash, suspicious lesions Neurological:     Denies dizziness, unexplained weakness, numbness  Psychiatric/Behavioral:   Denies mood changes, suicidal or homicidal ideations, hallucinations    Objective:   Blood pressure 136/70, pulse 76, height 5\' 1"  (1.549 m), weight 122 lb (55.3 kg). Body mass index is 23.05 kg/m. General:  Well Developed, well nourished, appropriate for stated age.  Neuro:  Alert and oriented,  extra-ocular muscles intact  HEENT:  Normocephalic, atraumatic, neck supple, no carotid bruits appreciated  Skin:  no gross rash, warm,  pink. Cardiac:  RRR, S1 S2 Respiratory:  ECTA B/L and A/P, Not using accessory muscles, speaking in full sentences- unlabored. Vascular:  Ext warm, no cyanosis apprec.; cap RF less 2 sec. Psych:  No HI/SI, judgement and insight good, Euthymic mood. Full Affect.

## 2016-09-08 ENCOUNTER — Telehealth: Payer: Self-pay | Admitting: Family Medicine

## 2016-09-08 ENCOUNTER — Encounter: Payer: Self-pay | Admitting: Family Medicine

## 2016-09-08 NOTE — Telephone Encounter (Signed)
Pt called states we can request Sleep study information from Dr. Molly Maduroobert Reede he ordered studies on her 11/05/11  & 12/09/11--she suggest we contact their office.

## 2016-09-08 NOTE — Telephone Encounter (Signed)
Please call Dr. Sharyon Cableeede's office and request these records.  Thanks!

## 2016-09-26 ENCOUNTER — Telehealth: Payer: Self-pay | Admitting: Family Medicine

## 2016-09-26 NOTE — Telephone Encounter (Signed)
Patient is requesting a refill of the simvastatin 20mg  sent to [facility] on [address]

## 2016-09-26 NOTE — Telephone Encounter (Signed)
Patient was seen 08/2016.  Lipid levels have not been checked since 08/2015.  Please advise simvastatin refill.

## 2016-09-27 MED ORDER — SIMVASTATIN 20 MG PO TABS: 20.0000 mg | ORAL_TABLET | Freq: Every day | ORAL | 0 refills | Status: DC

## 2016-09-27 NOTE — Addendum Note (Signed)
Addended by: Judd GaudierLEVENS, SHANNON M on: 09/27/2016 12:12 PM   Modules accepted: Orders

## 2016-09-27 NOTE — Telephone Encounter (Signed)
30d supply only.  Needs f/up ov

## 2016-10-19 ENCOUNTER — Ambulatory Visit: Payer: 59 | Admitting: Family Medicine

## 2016-10-20 ENCOUNTER — Encounter: Payer: Self-pay | Admitting: Family Medicine

## 2016-10-20 ENCOUNTER — Ambulatory Visit (INDEPENDENT_AMBULATORY_CARE_PROVIDER_SITE_OTHER): Payer: 59 | Admitting: Family Medicine

## 2016-10-20 DIAGNOSIS — E038 Other specified hypothyroidism: Secondary | ICD-10-CM

## 2016-10-20 DIAGNOSIS — F4323 Adjustment disorder with mixed anxiety and depressed mood: Secondary | ICD-10-CM

## 2016-10-20 DIAGNOSIS — E063 Autoimmune thyroiditis: Secondary | ICD-10-CM

## 2016-10-20 DIAGNOSIS — E559 Vitamin D deficiency, unspecified: Secondary | ICD-10-CM

## 2016-10-20 DIAGNOSIS — Z820 Family history of epilepsy and other diseases of the nervous system: Secondary | ICD-10-CM

## 2016-10-20 DIAGNOSIS — Z833 Family history of diabetes mellitus: Secondary | ICD-10-CM

## 2016-10-20 DIAGNOSIS — F5102 Adjustment insomnia: Secondary | ICD-10-CM

## 2016-10-20 DIAGNOSIS — M8580 Other specified disorders of bone density and structure, unspecified site: Secondary | ICD-10-CM

## 2016-10-20 DIAGNOSIS — E782 Mixed hyperlipidemia: Secondary | ICD-10-CM

## 2016-10-20 MED ORDER — ESCITALOPRAM OXALATE 10 MG PO TABS: 10.0000 mg | ORAL_TABLET | Freq: Every day | ORAL | 1 refills | Status: DC

## 2016-10-20 NOTE — Progress Notes (Signed)
Impression and Recommendations:    1. Hypothyroidism due to Hashimoto's thyroiditis   2. Mixed hyperlipidemia   3. Adjustment insomnia   4. Vitamin D deficiency   5. Osteopenia, unspecified location   6. Family history of diabetes mellitus in father   62. Family history of Alzheimer's disease   8. Adjustment disorder with mixed anxiety and depressed mood    - will need RF's of chol and thyroid meds near future- we'll wait till after blood work returns and then make adjustments when necessary  - obtain labs today- she is fasting excpt for cream in coffee  - For her sleep we will hold off on increasing Ambien CR from 6.25.   Daughter would like to see how the SSRI Effexor mother first before we increase that medicine.  - start lexapro for anxiety/ GAD and some depressed mood.  We all three had long discussion about comprehensive treatment for generalized anxiety and mixed depressive symptoms.  Her daughter as well as patient had many Solis questions and concerns that needed to be addressed today regarding her mother's symptoms and treatment plan/ prognosis etc.   Explained comprehensive treatment plan is needed to control the symptoms which includes counseling, exercise, adequate sleep, meditation and/or prayer and medications.  Explained it is not just one thing that will work but yet a combination of all of these things that will help her feel the best.     - Patient declined counseling today.  Pt was in the office today for 40+ minutes, with over 50% time spent in face to face counseling of patients various medical conditions, treatment plans of those medical conditions including medicine management and lifestyle modification, strategies to improve health and well being; and in coordination of care. SEE ABOVE FOR DETAILS  The patient was counseled, risk factors were discussed, anticipatory guidance given.   New Prescriptions   ESCITALOPRAM (LEXAPRO) 10 MG TABLET    Take 1 tablet  (10 mg total) by mouth daily.    Orders Placed This Encounter  Procedures  . CBC with Differential/Platelet  . Comprehensive metabolic panel  . Hemoglobin A1c  . Lipid panel  . T4, free  . TSH  . VITAMIN D 25 Hydroxy (Vit-D Deficiency, Fractures)     Gross side effects, risk and benefits, and alternatives of medications and treatment plan in general discussed with patient.  Patient is aware that all medications have potential side effects and we are unable to predict every side effect or drug-drug interaction that may occur.   Patient will call with any questions prior to using medication if they have concerns.  Expresses verbal understanding and consents to current therapy and treatment regimen.  No barriers to understanding were identified.  Red flag symptoms and signs discussed in detail.  Patient expressed understanding regarding what to do in case of emergency\urgent symptoms  Please see AVS handed out to patient at the end of our visit for further patient instructions/ counseling done pertaining to today's office visit.   Return in about 6 weeks (around 12/01/2016) for F-up of current med issues- started lexapro, f/up blood wrk.     Note: This document was prepared using Dragon voice recognition software and may include unintentional dictation errors.  Zuriyah Shatz 9:01 AM --------------------------------------------------------------------------------------------------------------------------------------------------------------------------------------------------------------------------------------------    Subjective:    CC:  Chief Complaint  Patient presents with  . Insomnia    patient is waking several times through the night - meds are helping    HPI:  Amanda Solis is a 66 y.o. female who presents to [facility] today for issues as discussed below.  Patient is here in the office today with her daughter Amanda Solis. She is here  for follow-up because last office visit we started patient on Ambien.  It is helping her sleep but she still having some awakenings during the evenings.  Her daughter Amy tells me today that she feels her mother has excess worry and a mood disorder which needs to be addressed as well today.  Patient has been having more anxiety and excess worry like we talked about last office visit.   This has her tearing up in the office today as she feels bad about this.   She's never been on a mood medicine.   Her daughter has postpartum depression and is on Paxil.   Patient tells me she doesn't want to feel weak.  She has many episodes of excess worry and finds herself not able to sleep at night since she is cannot seem to turn off her brain.    Nails are asking about her blood work and if she needs it today.  Patient only had a couple coffee with a little bit of flavored creamer this morning.    Her last blood work was 09/16/2015.    No problems updated.   Wt Readings from Last 3 Encounters:  09/07/16 122 lb (55.3 kg)  03/02/16 120 lb 3.2 oz (54.5 kg)  05/08/15 117 lb (53.1 kg)   BP Readings from Last 3 Encounters:  09/07/16 136/70  03/02/16 116/72  05/08/15 90/62   Pulse Readings from Last 3 Encounters:  09/07/16 76  03/02/16 90  05/08/15 60   BMI Readings from Last 3 Encounters:  09/07/16 23.05 kg/m  03/02/16 22.71 kg/m  05/08/15 22.11 kg/m     Patient Care Team    Relationship Specialty Notifications Start End  Thomasene Lot, DO PCP - General Family Medicine  03/02/16   Iva Boop, MD Consulting Physician Gastroenterology  03/02/16   Richardean Chimera, MD Consulting Physician Obstetrics and Gynecology  03/02/16      Patient Active Problem List   Diagnosis Date Noted  . HLD (hyperlipidemia) 03/02/2016    Priority: High  . Insomnia disorder- chronic 03/02/2016    Priority: High  . Hypothyroidism 03/02/2016    Priority: Medium  . Vitamin D deficiency 03/02/2016    Priority:  Medium  . Osteopenia 03/02/2016    Priority: Medium  . S/P cholecystectomy in 1978 03/02/2016    Priority: Low  . h/o Diverticulitis of colon without hemorrhage 2016 03/06/2015    Priority: Low  . Family history of Alzheimer's disease 09/07/2016  . Family history of breast cancer in sister 03/05/2016  . Family history of diabetes mellitus in father 03/05/2016    Past Medical history, Surgical history, Family history, Social history, Allergies and Medications have been entered into the medical record, reviewed and changed as needed.    Current Meds  Medication Sig  . Calcium Carbonate Antacid (TUMS E-X PO) Take 2.5 tablets by mouth daily.  . Cholecalciferol (VITAMIN D3) 2000 UNITS TABS Take 1 tablet by mouth daily.  . simvastatin (ZOCOR) 20 MG tablet Take 1 tablet (20 mg total) by mouth daily.  Marland Kitchen SYNTHROID 88 MCG tablet Take 1 tablet by mouth daily.  Marland Kitchen zolpidem (AMBIEN CR) 6.25 MG CR tablet 1-2 tabs q hs    Allergies:  No Known Allergies   Review of  Systems: General:   Denies fever, chills, unexplained weight loss.  Optho/Auditory:   Denies visual changes, blurred vision/LOV Respiratory:   Denies wheeze, DOE more than baseline levels.  Cardiovascular:   Denies chest pain, palpitations, new onset peripheral edema  Gastrointestinal:   Denies nausea, vomiting, diarrhea, abd pain.  Genitourinary: Denies dysuria, freq/ urgency, flank pain or discharge from genitals.  Endocrine:     Denies hot or cold intolerance, polyuria, polydipsia. Musculoskeletal:   Denies unexplained myalgias, joint swelling, unexplained arthralgias, gait problems.  Skin:  Denies new onset rash, suspicious lesions Neurological:     Denies dizziness, unexplained weakness, numbness  Psychiatric/Behavioral:   Denies mood changes, suicidal or homicidal ideations, hallucinations    Objective:   There were no vitals taken for this visit. There is no height or weight on file to calculate BMI. General:  Well  Developed, well nourished, appropriate for stated age.  Neuro:  Alert and oriented,  extra-ocular muscles intact  HEENT:  Normocephalic, atraumatic, neck supple, no carotid bruits appreciated  Skin:  no gross rash, warm, pink. Cardiac:  RRR, S1 S2 Respiratory:  ECTA B/L and A/P, Not using accessory muscles, speaking in full sentences- unlabored. Vascular:  Ext warm, no cyanosis apprec.; cap RF less 2 sec. Psych:  No HI/SI, judgement and insight Solis, Euthymic mood. Full Affect.

## 2016-10-20 NOTE — Patient Instructions (Addendum)
Escitalopram/ Lexapro:  1/2 tab daily after eating for 2 wks, then go to the 1 tab daily   If you have insomnia or difficulty sleeping, this information is for you:  Blue light blocking glasses  - Avoid caffeinated beverages after lunch,  no alcoholic beverages,  no eating within 2-3 hours of lying down,  avoid exposure to blue light before bed,  avoid daytime naps, and  needs to maintain a regular sleep schedule- go to sleep and wake up around the same time every night.   - Resolve concerns or worries before entering bedroom:  Discussed relaxation techniques with patient and to keep a journal to write down fears\ worries.  I suggested seeing a counselor for CBT.   - Recommend patient meditate or do deep breathing exercises to help relax.   Incorporate the use of white noise machines or listen to "sleep meditation music", or recordings of guided meditations for sleep from YouTube which are free, such as  "guided meditation for detachment from over thinking"  by Ina KickMichael Sealey.

## 2016-10-21 LAB — T4, FREE: Free T4: 0.78 ng/dL — ABNORMAL LOW (ref 0.82–1.77)

## 2016-10-21 LAB — COMPREHENSIVE METABOLIC PANEL
ALK PHOS: 57 IU/L (ref 39–117)
ALT: 12 IU/L (ref 0–32)
AST: 20 IU/L (ref 0–40)
Albumin/Globulin Ratio: 1.9 (ref 1.2–2.2)
Albumin: 5 g/dL — ABNORMAL HIGH (ref 3.6–4.8)
BUN/Creatinine Ratio: 21 (ref 12–28)
BUN: 19 mg/dL (ref 8–27)
Bilirubin Total: 0.4 mg/dL (ref 0.0–1.2)
CO2: 18 mmol/L — AB (ref 20–29)
Calcium: 10 mg/dL (ref 8.7–10.3)
Chloride: 101 mmol/L (ref 96–106)
Creatinine, Ser: 0.91 mg/dL (ref 0.57–1.00)
GFR calc Af Amer: 77 mL/min/{1.73_m2} (ref >59)
GFR, EST NON AFRICAN AMERICAN: 67 mL/min/{1.73_m2} (ref >59)
Globulin, Total: 2.7 g/dL (ref 1.5–4.5)
Glucose: 114 mg/dL — ABNORMAL HIGH (ref 65–99)
Potassium: 4.6 mmol/L (ref 3.5–5.2)
Sodium: 140 mmol/L (ref 134–144)
Total Protein: 7.7 g/dL (ref 6.0–8.5)

## 2016-10-21 LAB — CBC WITH DIFFERENTIAL/PLATELET
Basophils Absolute: 0 10*3/uL (ref 0.0–0.2)
Basos: 0 %
EOS (ABSOLUTE): 0 10*3/uL (ref 0.0–0.4)
Eos: 0 %
Hematocrit: 40.8 % (ref 34.0–46.6)
Hemoglobin: 13.8 g/dL (ref 11.1–15.9)
IMMATURE GRANULOCYTES: 0 %
Immature Grans (Abs): 0 10*3/uL (ref 0.0–0.1)
LYMPHS ABS: 0.8 10*3/uL (ref 0.7–3.1)
Lymphs: 8 %
MCH: 30.6 pg (ref 26.6–33.0)
MCHC: 33.8 g/dL (ref 31.5–35.7)
MCV: 91 fL (ref 79–97)
MONOCYTES: 5 %
MONOS ABS: 0.5 10*3/uL (ref 0.1–0.9)
NEUTROS PCT: 87 %
Neutrophils Absolute: 8.9 10*3/uL — ABNORMAL HIGH (ref 1.4–7.0)
Platelets: 358 10*3/uL (ref 150–379)
RBC: 4.51 x10E6/uL (ref 3.77–5.28)
RDW: 14.7 % (ref 12.3–15.4)
WBC: 10.3 10*3/uL (ref 3.4–10.8)

## 2016-10-21 LAB — LIPID PANEL
Chol/HDL Ratio: 2.5 ratio (ref 0.0–4.4)
Cholesterol, Total: 275 mg/dL — ABNORMAL HIGH (ref 100–199)
HDL: 111 mg/dL (ref >39)
LDL Calculated: 147 mg/dL — ABNORMAL HIGH (ref 0–99)
TRIGLYCERIDES: 85 mg/dL (ref 0–149)
VLDL Cholesterol Cal: 17 mg/dL (ref 5–40)

## 2016-10-21 LAB — HEMOGLOBIN A1C
ESTIMATED AVERAGE GLUCOSE: 114 mg/dL
HEMOGLOBIN A1C: 5.6 % (ref 4.8–5.6)

## 2016-10-21 LAB — TSH: TSH: 27.81 u[IU]/mL — AB (ref 0.450–4.500)

## 2016-10-21 LAB — VITAMIN D 25 HYDROXY (VIT D DEFICIENCY, FRACTURES): Vit D, 25-Hydroxy: 39.9 ng/mL (ref 30.0–100.0)

## 2016-10-24 ENCOUNTER — Other Ambulatory Visit: Payer: Self-pay | Admitting: Family Medicine

## 2016-12-01 ENCOUNTER — Ambulatory Visit (INDEPENDENT_AMBULATORY_CARE_PROVIDER_SITE_OTHER): Payer: 59 | Admitting: Family Medicine

## 2016-12-01 ENCOUNTER — Encounter: Payer: Self-pay | Admitting: Family Medicine

## 2016-12-01 VITALS — BP 126/68 | HR 66 | Ht 61.0 in | Wt 123.1 lb

## 2016-12-01 DIAGNOSIS — G4700 Insomnia, unspecified: Secondary | ICD-10-CM

## 2016-12-01 DIAGNOSIS — E063 Autoimmune thyroiditis: Secondary | ICD-10-CM

## 2016-12-01 DIAGNOSIS — E782 Mixed hyperlipidemia: Secondary | ICD-10-CM

## 2016-12-01 DIAGNOSIS — M8580 Other specified disorders of bone density and structure, unspecified site: Secondary | ICD-10-CM

## 2016-12-01 DIAGNOSIS — F5102 Adjustment insomnia: Secondary | ICD-10-CM

## 2016-12-01 DIAGNOSIS — E038 Other specified hypothyroidism: Secondary | ICD-10-CM

## 2016-12-01 DIAGNOSIS — E559 Vitamin D deficiency, unspecified: Secondary | ICD-10-CM

## 2016-12-01 MED ORDER — ZOLPIDEM TARTRATE ER 6.25 MG PO TBCR: EXTENDED_RELEASE_TABLET | ORAL | 2 refills | Status: DC

## 2016-12-01 MED ORDER — LEVOTHYROXINE SODIUM 112 MCG PO TABS: 112.0000 ug | ORAL_TABLET | Freq: Every day | ORAL | 3 refills | Status: DC

## 2016-12-01 MED ORDER — ESCITALOPRAM OXALATE 20 MG PO TABS: 20.0000 mg | ORAL_TABLET | Freq: Every day | ORAL | 1 refills | Status: DC

## 2016-12-01 MED ORDER — SYNTHROID 88 MCG PO TABS: 88.0000 ug | ORAL_TABLET | Freq: Every day | ORAL | 1 refills | Status: DC

## 2016-12-01 NOTE — Patient Instructions (Addendum)
4 your Lexapro or Escitalopram continue to take the 10 mg for 2 more weeks should be a total of 8- if after 8 weeks you're not feeling significantly improved then go to go ahead to 20 mg a day.  For the zolpidem or Ambien you can take 2 tablets a day each meal every evening to help you sleep.  As we discussed try to take those steroids earlier in the day and ended around 45 instead of bedtime   Also with your Synthroid you're going to stop your 88 g daily and start the 112 g daily that I sent to your pharmacy.  Take this every day and when you follow up in 6 weeks we will recheck your thyroid levels.

## 2016-12-01 NOTE — Progress Notes (Signed)
Impression and Recommendations:    1. Adjustment insomnia   2. Hypothyroidism due to Hashimoto's thyroiditis   3. Vitamin D deficiency   4. Osteopenia, unspecified location   5. Mixed hyperlipidemia   6. Insomnia, unspecified type    4 your Lexapro or Escitalopram continue to take the 10 mg for 2 more weeks should be a total of 8- if after 8 weeks you're not feeling significantly improved then go to go ahead to 20 mg a day.  For the zolpidem or Ambien you can take 2 tablets a day each meal every evening to help you sleep.  As we discussed try to take those steroids earlier in the day and ended around 45 instead of bedtime   Also with your Synthroid you're going to stop your 88 g daily and start the 112 g daily that I sent to your pharmacy.  Take this every day and when you follow up in 6 weeks we will recheck your thyroid levels.   No problem-specific Assessment & Plan notes found for this encounter.   The patient was counseled, risk factors were discussed, anticipatory guidance given.   New Prescriptions   LEVOTHYROXINE (SYNTHROID, LEVOTHROID) 112 MCG TABLET    Take 1 tablet (112 mcg total) by mouth daily.     Discontinued Medications   SYNTHROID 88 MCG TABLET    Take 1 tablet by mouth daily.     Modified Medications   Modified Medication Previous Medication   ESCITALOPRAM (LEXAPRO) 20 MG TABLET escitalopram (LEXAPRO) 10 MG tablet      Take 1 tablet (20 mg total) by mouth daily.    Take 1 tablet (10 mg total) by mouth daily.   ZOLPIDEM (AMBIEN CR) 6.25 MG CR TABLET zolpidem (AMBIEN CR) 6.25 MG CR tablet      1-2 tabs q hs    1-2 tabs q hs      No orders of the defined types were placed in this encounter.    Gross side effects, risk and benefits, and alternatives of medications and treatment plan in general discussed with patient.  Patient is aware that all medications have potential side effects and we are unable to predict every side effect or drug-drug  interaction that may occur.   Patient will call with any questions prior to using medication if they have concerns.  Expresses verbal understanding and consents to current therapy and treatment regimen.  No barriers to understanding were identified.  Red flag symptoms and signs discussed in detail.  Patient expressed understanding regarding what to do in case of emergency\urgent symptoms  Please see AVS handed out to patient at the end of our visit for further patient instructions/ counseling done pertaining to today's office visit.   Return for 6wks- f/up inc dose lexapro, and synthroid.     Note: This document was prepared using Dragon voice recognition software and may include unintentional dictation errors.  Thomasene Loteborah Brendi Mccarroll 9:18 AM --------------------------------------------------------------------------------------------------------------------------------------------------------------------------------------------------------------------------------------------    Subjective:    CC:  Chief Complaint  Patient presents with  . Follow-up    HPI: Amanda Solis is a 66 y.o. female who [facility] for issues as discussed below.   No problems updated.   Wt Readings from Last 3 Encounters:  12/01/16 123 lb 1.6 oz (55.8 kg)  09/07/16 122 lb (55.3 kg)  03/02/16 120 lb 3.2 oz (54.5 kg)   BP Readings from Last 3 Encounters:  12/01/16 126/68  09/07/16 136/70  03/02/16 116/72   Pulse Readings from Last 3 Encounters:  12/01/16 66  09/07/16 76  03/02/16 90   BMI Readings from Last 3 Encounters:  12/01/16 23.26 kg/m  09/07/16 23.05 kg/m  03/02/16 22.71 kg/m     Patient Care Team    Relationship Specialty Notifications Start End  Thomasene Lot, DO PCP - General Family Medicine  03/02/16   Iva Boop, MD Consulting Physician Gastroenterology  03/02/16   Richardean Chimera, MD Consulting Physician Obstetrics and  Gynecology  03/02/16      Patient Active Problem List   Diagnosis Date Noted  . HLD (hyperlipidemia) 03/02/2016    Priority: High  . Insomnia disorder- chronic 03/02/2016    Priority: High  . Hypothyroidism 03/02/2016    Priority: Medium  . Vitamin D deficiency 03/02/2016    Priority: Medium  . Osteopenia 03/02/2016    Priority: Medium  . S/P cholecystectomy in 1978 03/02/2016    Priority: Low  . h/o Diverticulitis of colon without hemorrhage 2016 03/06/2015    Priority: Low  . Family history of Alzheimer's disease 09/07/2016  . Family history of breast cancer in sister 03/05/2016  . Family history of diabetes mellitus in father 03/05/2016    Past Medical history, Surgical history, Family history, Social history, Allergies and Medications have been entered into the medical record, reviewed and changed as needed.    Current Meds  Medication Sig  . Calcium Carbonate Antacid (TUMS E-X PO) Take 2.5 tablets by mouth daily.  . Cholecalciferol (VITAMIN D3) 2000 UNITS TABS Take 1 tablet by mouth daily.  Marland Kitchen escitalopram (LEXAPRO) 20 MG tablet Take 1 tablet (20 mg total) by mouth daily.  . methocarbamol (ROBAXIN) 500 MG tablet TAKE 1 TABLET BY MOUTH EVERY 6 HOURS AS NEEDED FOR SPASM  . methylPREDNISolone (MEDROL DOSEPAK) 4 MG TBPK tablet   . simvastatin (ZOCOR) 20 MG tablet Take 1 tablet (20 mg total) by mouth daily at 6 PM.  . zolpidem (AMBIEN CR) 6.25 MG CR tablet 1-2 tabs q hs  . [DISCONTINUED] escitalopram (LEXAPRO) 10 MG tablet Take 1 tablet (10 mg total) by mouth daily.  . [DISCONTINUED] SYNTHROID 88 MCG tablet Take 1 tablet by mouth daily.  . [DISCONTINUED] SYNTHROID 88 MCG tablet Take 1 tablet (88 mcg total) by mouth daily.  . [DISCONTINUED] zolpidem (AMBIEN CR) 6.25 MG CR tablet 1-2 tabs q hs    Allergies:  No Known Allergies   Review of Systems: General:   Denies fever, chills, unexplained weight loss.  Optho/Auditory:   Denies visual changes, blurred  vision/LOV Respiratory:   Denies wheeze, DOE more than baseline levels.  Cardiovascular:   Denies chest pain, palpitations, new onset peripheral edema  Gastrointestinal:   Denies nausea, vomiting, diarrhea, abd pain.  Genitourinary: Denies dysuria, freq/ urgency, flank pain or discharge from genitals.  Endocrine:     Denies hot or cold intolerance, polyuria, polydipsia. Musculoskeletal:   Denies unexplained myalgias, joint swelling, unexplained arthralgias, gait problems.  Skin:  Denies new onset rash, suspicious lesions Neurological:     Denies dizziness, unexplained weakness, numbness  Psychiatric/Behavioral:   Denies mood changes, suicidal or homicidal ideations, hallucinations    Objective:   Blood pressure 126/68, pulse 66, height 5\' 1"  (1.549 m), weight 123 lb 1.6 oz (55.8 kg). Body mass index is 23.26 kg/m. General:  Well Developed, well nourished, appropriate for stated age.  Neuro:  Alert and oriented,  extra-ocular muscles intact  HEENT:  Normocephalic, atraumatic, neck supple, no  carotid bruits appreciated  Skin:  no gross rash, warm, pink. Cardiac:  RRR, S1 S2 Respiratory:  ECTA B/L and A/P, Not using accessory muscles, speaking in full sentences- unlabored. Vascular:  Ext warm, no cyanosis apprec.; cap RF less 2 sec. Psych:  No HI/SI, judgement and insight good, Euthymic mood. Full Affect.

## 2017-01-17 ENCOUNTER — Ambulatory Visit: Payer: 59 | Admitting: Family Medicine

## 2017-01-24 ENCOUNTER — Ambulatory Visit (INDEPENDENT_AMBULATORY_CARE_PROVIDER_SITE_OTHER): Payer: 59 | Admitting: Family Medicine

## 2017-01-24 ENCOUNTER — Encounter: Payer: Self-pay | Admitting: Family Medicine

## 2017-01-24 VITALS — BP 130/80 | HR 65 | Ht 61.0 in | Wt 125.6 lb

## 2017-01-24 DIAGNOSIS — F5102 Adjustment insomnia: Secondary | ICD-10-CM

## 2017-01-24 DIAGNOSIS — E063 Autoimmune thyroiditis: Secondary | ICD-10-CM | POA: Diagnosis not present

## 2017-01-24 DIAGNOSIS — E559 Vitamin D deficiency, unspecified: Secondary | ICD-10-CM | POA: Diagnosis not present

## 2017-01-24 DIAGNOSIS — E038 Other specified hypothyroidism: Secondary | ICD-10-CM

## 2017-01-24 DIAGNOSIS — G4700 Insomnia, unspecified: Secondary | ICD-10-CM | POA: Diagnosis not present

## 2017-01-24 MED ORDER — ZOLPIDEM TARTRATE ER 6.25 MG PO TBCR: EXTENDED_RELEASE_TABLET | ORAL | 2 refills | Status: DC

## 2017-01-24 MED ORDER — ESCITALOPRAM OXALATE 10 MG PO TABS: 10.0000 mg | ORAL_TABLET | Freq: Every day | ORAL | 1 refills | Status: DC

## 2017-01-24 NOTE — Patient Instructions (Addendum)
[place] sound machine  Exercise outside  Yoga, meditation or prayer

## 2017-01-24 NOTE — Progress Notes (Signed)
Impression and Recommendations:    1. Adjustment insomnia   2. Hypothyroidism due to Hashimoto's thyroiditis   3. Vitamin D deficiency   4. Insomnia, unspecified type    4 your Lexapro or Escitalopram continue to take the 10 mg for 2 more weeks should be a total of 8 weeks of treatment - if after 8 weeks you're not feeling significantly improved then go to go ahead to 20 mg a day.  For the zolpidem or Ambien you can take 2 tablets a day after dinner to help with sleep.  As we discussed try to take those steroids earlier in the day and ended around 45 instead of bedtime   Also with your Synthroid you're going to stop your 88 g daily and start the 112 g daily that I sent to your pharmacy.  Take this every day and when you follow up in 6 weeks we will recheck your thyroid levels.   No problem-specific Assessment & Plan notes found for this encounter.   The patient was counseled, risk factors were discussed, anticipatory guidance given.   New Prescriptions   No medications on file     Discontinued Medications   METHOCARBAMOL (ROBAXIN) 500 MG TABLET    TAKE 1 TABLET BY MOUTH EVERY 6 HOURS AS NEEDED FOR SPASM   METHYLPREDNISOLONE (MEDROL DOSEPAK) 4 MG TBPK TABLET         Modified Medications   Modified Medication Previous Medication   ESCITALOPRAM (LEXAPRO) 10 MG TABLET escitalopram (LEXAPRO) 20 MG tablet      Take 1 tablet (10 mg total) by mouth daily.    Take 1 tablet (20 mg total) by mouth daily.   ZOLPIDEM (AMBIEN CR) 6.25 MG CR TABLET zolpidem (AMBIEN CR) 6.25 MG CR tablet      1-2 tabs q hs    1-2 tabs q hs      Orders Placed This Encounter  Procedures  . TSH + free T4     Gross side effects, risk and benefits, and alternatives of medications and treatment plan in general discussed with patient.  Patient is aware that all medications have potential side effects and we are unable to predict every side effect or drug-drug interaction that may occur.    Patient will call with any questions prior to using medication if they have concerns.  Expresses verbal understanding and consents to current therapy and treatment regimen.  No barriers to understanding were identified.  Red flag symptoms and signs discussed in detail.  Patient expressed understanding regarding what to do in case of emergency\urgent symptoms  Please see AVS handed out to patient at the end of our visit for further patient instructions/ counseling done pertaining to today's office visit.   Return for 4-6 mo f/up- copme fasting.     Note: This document was prepared using Dragon voice recognition software and may include unintentional dictation errors.  Amanda Solis 11:23 AM --------------------------------------------------------------------------------------------------------------------------------------------------------------------------------------------------------------------------------------------    Subjective:    CC:  Chief Complaint  Patient presents with  . Follow-up    HPI: Amanda Solis is a 66 y.o. female who presents to [facility] today for issues as discussed below.  - Last office visit increased her Lexapro from 10-20.  Patient had side effects of hot flashes and sweating episodes.  Did not feel well on it.  When back down to the 10 mg daily.  Her symptoms went away.     Also last office  visit started on higher dose of Synthroid  Ortho- [facility]--  Seen for spinal stenosis.  Had physical therapy for several weeks.  Doing much better.  Still not on her bike because of her back pain however she is walking some.    No counselor-->     Wt Readings from Last 3 Encounters:  01/24/17 125 lb 9.6 oz (57 kg)  12/01/16 123 lb 1.6 oz (55.8 kg)  09/07/16 122 lb (55.3 kg)   BP Readings from Last 3 Encounters:  01/24/17 130/80  12/01/16 126/68  09/07/16 136/70   Pulse Readings from Last 3 Encounters:    01/24/17 65  12/01/16 66  09/07/16 76   BMI Readings from Last 3 Encounters:  01/24/17 23.73 kg/m  12/01/16 23.26 kg/m  09/07/16 23.05 kg/m     Patient Care Team    Relationship Specialty Notifications Start End  Thomasene Lot, DO PCP - General Family Medicine  03/02/16   Iva Boop, MD Consulting Physician Gastroenterology  03/02/16   Richardean Chimera, MD Consulting Physician Obstetrics and Gynecology  03/02/16      Patient Active Problem List   Diagnosis Date Noted  . HLD (hyperlipidemia) 03/02/2016    Priority: High  . Insomnia 03/02/2016    Priority: High  . Hypothyroidism 03/02/2016    Priority: Medium  . Vitamin D deficiency 03/02/2016    Priority: Medium  . Osteopenia 03/02/2016    Priority: Medium  . S/P cholecystectomy in 1978 03/02/2016    Priority: Low  . h/o Diverticulitis of colon without hemorrhage 2016 03/06/2015    Priority: Low  . Family history of Alzheimer's disease 09/07/2016  . Family history of breast cancer in sister 03/05/2016  . Family history of diabetes mellitus in father 03/05/2016    Past Medical history, Surgical history, Family history, Social history, Allergies and Medications have been entered into the medical record, reviewed and changed as needed.    Current Meds  Medication Sig  . Calcium Carbonate Antacid (TUMS E-X PO) Take 2.5 tablets by mouth daily.  . Cholecalciferol (VITAMIN D3) 2000 UNITS TABS Take 1 tablet by mouth daily.    Allergies:  No Known Allergies   Review of Systems: General:   Denies fever, chills, unexplained weight loss.  Optho/Auditory:   Denies visual changes, blurred vision/LOV Respiratory:   Denies wheeze, DOE more than baseline levels.  Cardiovascular:   Denies chest pain, palpitations, new onset peripheral edema  Gastrointestinal:   Denies nausea, vomiting, diarrhea, abd pain.  Genitourinary: Denies dysuria, freq/ urgency, flank pain or discharge from genitals.  Endocrine:     Denies hot or  cold intolerance, polyuria, polydipsia. Musculoskeletal:   Denies unexplained myalgias, joint swelling, unexplained arthralgias, gait problems.  Skin:  Denies new onset rash, suspicious lesions Neurological:     Denies dizziness, unexplained weakness, numbness  Psychiatric/Behavioral:   Denies mood changes, suicidal or homicidal ideations, hallucinations    Objective:   Blood pressure 130/80, pulse 65, height  (1.549 m), weight 125 lb 9.6 oz (57 kg). Body mass index is 23.73 kg/m. General:  Well Developed, well nourished, appropriate for stated age.  Neuro:  Alert and oriented,  extra-ocular muscles intact  HEENT:  Normocephalic, atraumatic, neck supple, no carotid bruits appreciated  Skin:  no gross rash, warm, pink. Cardiac:  RRR, S1 S2 Respiratory:  ECTA B/L and A/P, Not using accessory muscles, speaking in full sentences- unlabored. Vascular:  Ext warm, no cyanosis apprec.; cap RF less 2 sec. Psych:  No HI/SI, judgement and insight good, Euthymic mood. Full Affect.

## 2017-01-25 LAB — TSH+FREE T4
FREE T4: 1.74 ng/dL (ref 0.82–1.77)
TSH: 0.621 u[IU]/mL (ref 0.450–4.500)

## 2017-04-05 ENCOUNTER — Ambulatory Visit: Payer: 59 | Admitting: Gastroenterology

## 2017-04-10 ENCOUNTER — Ambulatory Visit: Payer: 59 | Admitting: Gastroenterology

## 2017-04-19 ENCOUNTER — Encounter: Payer: Self-pay | Admitting: Adult Health

## 2017-04-19 ENCOUNTER — Ambulatory Visit (INDEPENDENT_AMBULATORY_CARE_PROVIDER_SITE_OTHER): Payer: 59 | Admitting: Adult Health

## 2017-04-19 VITALS — BP 111/74 | HR 89 | Temp 98.2°F | Ht 61.0 in | Wt 124.3 lb

## 2017-04-19 DIAGNOSIS — J069 Acute upper respiratory infection, unspecified: Secondary | ICD-10-CM | POA: Diagnosis not present

## 2017-04-19 DIAGNOSIS — B9789 Other viral agents as the cause of diseases classified elsewhere: Secondary | ICD-10-CM

## 2017-04-19 MED ORDER — HYDROCOD POLST-CPM POLST ER 10-8 MG/5ML PO SUER: 5.0000 mL | Freq: Two times a day (BID) | ORAL | 0 refills | Status: DC | PRN

## 2017-04-19 NOTE — Patient Instructions (Signed)
Cough, Adult Coughing is a reflex that clears your throat and your airways. Coughing helps to heal and protect your lungs. It is normal to cough occasionally, but a cough that happens with other symptoms or lasts a long time may be a sign of a condition that needs treatment. A cough may last only 2-3 weeks (acute), or it may last longer than 8 weeks (chronic). What are the causes? Coughing is commonly caused by:  Breathing in substances that irritate your lungs.  A viral or bacterial respiratory infection.  Allergies.  Asthma.  Postnasal drip.  Smoking.  Acid backing up from the stomach into the esophagus (gastroesophageal reflux).  Certain medicines.  Chronic lung problems, including COPD (or rarely, lung cancer).  Other medical conditions such as heart failure.  Follow these instructions at home: Pay attention to any changes in your symptoms. Take these actions to help with your discomfort:  Take medicines only as told by your health care provider. ? If you were prescribed an antibiotic medicine, take it as told by your health care provider. Do not stop taking the antibiotic even if you start to feel better. ? Talk with your health care provider before you take a cough suppressant medicine.  Drink enough fluid to keep your urine clear or pale yellow.  If the air is dry, use a cold steam vaporizer or humidifier in your bedroom or your home to help loosen secretions.  Avoid anything that causes you to cough at work or at home.  If your cough is worse at night, try sleeping in a semi-upright position.  Avoid cigarette smoke. If you smoke, quit smoking. If you need help quitting, ask your health care provider.  Avoid caffeine.  Avoid alcohol.  Rest as needed.  Contact a health care provider if:  You have new symptoms.  You cough up pus.  Your cough does not get better after 2-3 weeks, or your cough gets worse.  You cannot control your cough with suppressant  medicines and you are losing sleep.  You develop pain that is getting worse or pain that is not controlled with pain medicines.  You have a fever.  You have unexplained weight loss.  You have night sweats. Get help right away if:  You cough up blood.  You have difficulty breathing.  Your heartbeat is very fast. This information is not intended to replace advice given to you by your health care provider. Make sure you discuss any questions you have with your health care provider. Document Released: 10/08/2010 Document Revised: 09/17/2015 Document Reviewed: 06/18/2014 Elsevier Interactive Patient Education  2018 [employer]   Increase fluids, strive for at least 60 oz/day. Increase rest and use cough syrup as directed. Increase vit c intake, 2000mg  a day when not feeling well. If symptoms persist into next week (past 04/26/17), please call clinic and we will send in antibiotic. FEEL BETTER!

## 2017-04-19 NOTE — Progress Notes (Signed)
Subjective:    Patient ID: Amanda Solis, female    DOB: 07/19/1950, 66 y.o.   MRN: [id number]  HPI: Amanda Solis is here for constant, non-productive cough that started 6 days ago.  He her husband was dx'd "with a viral cough" last week.  She ha been taking OTC Mucinex without much sx relief and estimates to drink 2 bottles water/day. She reports mild, intermittent dull frontal HA, rated 2/10 She denies CP/dyspea/dizziness/N/V/Dpalpitations/fever/night sweats. She reports only slight clear nasal drainage, and denies facial/sinus tenderness. [facility]  Patient Care Team    Relationship Specialty Notifications Start End  Thomasene Lotpalski, Deborah, DO PCP - General Family Medicine  03/02/16   Iva BoopGessner, Carl E, MD Consulting Physician Gastroenterology  03/02/16   Richardean ChimeraMcComb, John, MD Consulting Physician Obstetrics and Gynecology  03/02/16     Patient Active Problem List   Diagnosis Date Noted  . Viral URI with cough 04/19/2017  . Family history of Alzheimer's disease 09/07/2016  . Family history of breast cancer in sister 03/05/2016  . Family history of diabetes mellitus in father 03/05/2016  . S/P cholecystectomy in 1978 03/02/2016  . HLD (hyperlipidemia) 03/02/2016  . Hypothyroidism 03/02/2016  . Vitamin D deficiency 03/02/2016  . Osteopenia 03/02/2016  . Insomnia 03/02/2016  . h/o Diverticulitis of colon without hemorrhage 2016 03/06/2015     Past Medical History:  Diagnosis Date  . Diverticulitis   . Hyperlipidemia   . Thyroid disease      Past Surgical History:  Procedure Laterality Date  . APPENDECTOMY    . CHOLECYSTECTOMY    . TUBAL LIGATION    . VARICOSE VEIN SURGERY       Family History  Problem Relation Age of Onset  . Diabetes Mother   . Prostate cancer Father   . Alcohol abuse Father   . Breast cancer Sister   . Colon cancer Neg Hx      Social History   Substance and Sexual Activity  Drug Use No     Social History   Substance  and Sexual Activity  Alcohol Use Yes  . Alcohol/week: 0.0 oz   Comment: may monthly,1 glass wine occasionally     Social History   Tobacco Use  Smoking Status Former Smoker  . Packs/day: 0.25  . Years: 2.00  . Pack years: 0.50  . Types: Cigarettes  . Last attempt to quit: 04/25/1980  . Years since quitting: 37.0  Smokeless Tobacco Never Used     Outpatient Encounter Medications as of 04/19/2017  Medication Sig  . Calcium Carbonate Antacid (TUMS E-X PO) Take 2.5 tablets by mouth daily.  . Cholecalciferol (VITAMIN D3) 2000 UNITS TABS Take 1 tablet by mouth daily.  Marland Kitchen. escitalopram (LEXAPRO) 10 MG tablet Take 1 tablet (10 mg total) by mouth daily.  Marland Kitchen. levothyroxine (SYNTHROID, LEVOTHROID) 112 MCG tablet Take 1 tablet (112 mcg total) by mouth daily.  . simvastatin (ZOCOR) 20 MG tablet Take 1 tablet (20 mg total) by mouth daily at 6 PM.  . zolpidem (AMBIEN CR) 6.25 MG CR tablet 1-2 tabs q hs  . chlorpheniramine-HYDROcodone (TUSSIONEX PENNKINETIC ER) 10-8 MG/5ML SUER Take 5 mLs by mouth every 12 (twelve) hours as needed for cough.   No facility-administered encounter medications on file as of 04/19/2017.     Allergies: Patient has no known allergies.  Body mass index is 23.49 kg/m.  Blood pressure 111/74, pulse 89, temperature 98.2 F (36.8 C), temperature source Oral, height 5\' 1"  (1.549 m),  weight 124 lb 4.8 oz (56.4 kg), SpO2 96 %.      Review of Systems  Constitutional: Positive for activity change and fatigue. Negative for appetite change, chills, diaphoresis and fever.  HENT: Negative for congestion, postnasal drip, rhinorrhea, sinus pain, sneezing, sore throat, trouble swallowing and voice change.   Eyes: Negative for visual disturbance.  Respiratory: Positive for cough. Negative for chest tightness, shortness of breath, wheezing and stridor.   Cardiovascular: Negative for chest pain, palpitations and leg swelling.  Gastrointestinal: Negative for abdominal distention,  abdominal pain, blood in stool, constipation, diarrhea, nausea and vomiting.  Endocrine: Negative for cold intolerance, heat intolerance, polydipsia, polyphagia and polyuria.  Genitourinary: Negative for difficulty urinating and flank pain.  Neurological: Positive for headaches. Negative for dizziness.  Hematological: Does not bruise/bleed easily.  Psychiatric/Behavioral: Positive for sleep disturbance.       Objective:   Physical Exam  Constitutional: She is oriented to person, place, and time. She appears well-developed and well-nourished. No distress.  HENT:  Head: Normocephalic and atraumatic.  Right Ear: Hearing, tympanic membrane, external ear and ear canal normal. Tympanic membrane is not erythematous and not bulging. No decreased hearing is noted.  Left Ear: Hearing, tympanic membrane, external ear and ear canal normal. Tympanic membrane is not erythematous and not bulging. No decreased hearing is noted.  Nose: Nose normal. Right sinus exhibits no maxillary sinus tenderness and no frontal sinus tenderness. Left sinus exhibits no maxillary sinus tenderness and no frontal sinus tenderness.  Mouth/Throat: Uvula is midline, oropharynx is clear and moist and mucous membranes are normal.  Eyes: Conjunctivae are normal. Pupils are equal, round, and reactive to light.  Neck: Normal range of motion. Neck supple.  Cardiovascular: Normal rate, regular rhythm, normal heart sounds and intact distal pulses.  No murmur heard. Pulmonary/Chest: Effort normal and breath sounds normal. No respiratory distress. She has no wheezes. She has no rales. She exhibits no tenderness.  Abdominal: Soft. Bowel sounds are normal. She exhibits no distension and no mass. There is no tenderness. There is no rebound and no guarding.  Lymphadenopathy:    She has no cervical adenopathy.  Neurological: She is alert and oriented to person, place, and time. Coordination normal.  Skin: Skin is warm and dry. No rash noted.  She is not diaphoretic. No erythema. No pallor.  Psychiatric: She has a normal mood and affect. Her behavior is normal. Judgment and thought content normal.  Nursing note and vitals reviewed.         Assessment & Plan:   1. Viral URI with cough     Viral URI with cough Increase fluids, strive for at least 60 oz/day. [place] Controlled Substance Database reviewed- no contraindications noted. Increase rest and use Tusionex as directed. Increase vit c intake, 2000mg  a day when not feeling well. If symptoms persist into next week (past 04/26/17), please call clinic and we will send in antibiotic.    FOLLOW-UP:  Return if symptoms worsen or fail to improve.

## 2017-04-19 NOTE — Assessment & Plan Note (Signed)
Increase fluids, strive for at least 60 oz/day. [place] Controlled Substance Database reviewed- no contraindications noted. Increase rest and use Tusionex as directed. Increase vit c intake, 2000mg  a day when not feeling well. If symptoms persist into next week (past 04/26/17), please call clinic and we will send in antibiotic.

## 2017-05-25 ENCOUNTER — Ambulatory Visit: Payer: 59 | Admitting: Gastroenterology

## 2017-06-27 DIAGNOSIS — M170 Bilateral primary osteoarthritis of knee: Secondary | ICD-10-CM | POA: Diagnosis not present

## 2017-06-27 DIAGNOSIS — M1712 Unilateral primary osteoarthritis, left knee: Secondary | ICD-10-CM | POA: Diagnosis not present

## 2017-06-27 DIAGNOSIS — M1711 Unilateral primary osteoarthritis, right knee: Secondary | ICD-10-CM | POA: Diagnosis not present

## 2017-07-11 ENCOUNTER — Ambulatory Visit
Admission: RE | Admit: 2017-07-11 | Discharge: 2017-07-11 | Disposition: A | Discharge status: Home / Self Care | Payer: 59 | Source: Ambulatory Visit | Attending: Family Medicine | Admitting: Family Medicine

## 2017-07-11 ENCOUNTER — Other Ambulatory Visit: Payer: Self-pay | Admitting: Family Medicine

## 2017-07-11 ENCOUNTER — Telehealth: Payer: Self-pay | Admitting: Family Medicine

## 2017-07-11 DIAGNOSIS — R05 Cough: Secondary | ICD-10-CM

## 2017-07-11 DIAGNOSIS — M1711 Unilateral primary osteoarthritis, right knee: Secondary | ICD-10-CM | POA: Diagnosis not present

## 2017-07-11 DIAGNOSIS — E039 Hypothyroidism, unspecified: Secondary | ICD-10-CM | POA: Diagnosis not present

## 2017-07-11 DIAGNOSIS — F419 Anxiety disorder, unspecified: Secondary | ICD-10-CM | POA: Diagnosis not present

## 2017-07-11 DIAGNOSIS — R059 Cough, unspecified: Secondary | ICD-10-CM

## 2017-07-11 DIAGNOSIS — G4700 Insomnia, unspecified: Secondary | ICD-10-CM | POA: Diagnosis not present

## 2017-07-11 DIAGNOSIS — E7800 Pure hypercholesterolemia, unspecified: Secondary | ICD-10-CM | POA: Diagnosis not present

## 2017-07-14 DIAGNOSIS — E7800 Pure hypercholesterolemia, unspecified: Secondary | ICD-10-CM | POA: Diagnosis not present

## 2017-07-14 DIAGNOSIS — E039 Hypothyroidism, unspecified: Secondary | ICD-10-CM | POA: Diagnosis not present

## 2017-07-16 DIAGNOSIS — N390 Urinary tract infection, site not specified: Secondary | ICD-10-CM | POA: Diagnosis not present

## 2017-07-25 ENCOUNTER — Ambulatory Visit: Payer: 59 | Admitting: Family Medicine

## 2017-08-17 NOTE — H&P (Signed)
   Amanda Solis is an 67 y.o. female.    Chief Complaint: right knee pain  HPI: Pt is a 67 y.o. female complaining of right knee pain for multiple years. Pain had continually increased since the beginning. X-rays in the clinic show end-stage arthritic changes of the right knee. Pt has tried various conservative treatments which have failed to alleviate their symptoms, including injections and therapy. Various options are discussed with the patient. Risks, benefits and expectations were discussed with the patient. Patient understand the risks, benefits and expectations and wishes to proceed with surgery.   PCP:  Wilfrid LundBecker, Anna G, PA  D/C Plans: Home  PMH: Past Medical History:  Diagnosis Date  . Diverticulitis   . Hyperlipidemia   . Thyroid disease     PSH: Past Surgical History:  Procedure Laterality Date  . APPENDECTOMY    . CHOLECYSTECTOMY    . TUBAL LIGATION    . VARICOSE VEIN SURGERY      Social History:  reports that she quit smoking about 37 years ago. Her smoking use included cigarettes. She has a 0.50 pack-year smoking history. She has never used smokeless tobacco. She reports that she drinks alcohol. She reports that she does not use drugs.  Allergies:  No Known Allergies  Medications: No current facility-administered medications for this encounter.    Current Outpatient Medications  Medication Sig Dispense Refill  . Calcium Carbonate Antacid (TUMS E-X PO) Take 2.5 tablets by mouth daily.    . chlorpheniramine-HYDROcodone (TUSSIONEX PENNKINETIC ER) 10-8 MG/5ML SUER Take 5 mLs by mouth every 12 (twelve) hours as needed for cough. 140 mL 0  . Cholecalciferol (VITAMIN D3) 2000 UNITS TABS Take 1 tablet by mouth daily.    Marland Kitchen. escitalopram (LEXAPRO) 10 MG tablet Take 1 tablet (10 mg total) by mouth daily. 90 tablet 1  . levothyroxine (SYNTHROID, LEVOTHROID) 112 MCG tablet Take 1 tablet (112 mcg total) by mouth daily. 90 tablet 3  . simvastatin (ZOCOR) 20 MG tablet  Take 1 tablet (20 mg total) by mouth daily at 6 PM. 90 tablet 1  . zolpidem (AMBIEN CR) 6.25 MG CR tablet 1-2 tabs q hs 60 tablet 2    No results found for this or any previous visit (from the past 48 hour(s)). No results found.  ROS: Pain with rom of the right lower extremity  Physical Exam:  Alert and oriented 67 y.o. female in no acute distress Cranial nerves 2-12 intact Cervical spine: full rom with no tenderness, nv intact distally Chest: active breath sounds bilaterally, no wheeze rhonchi or rales Heart: regular rate and rhythm, no murmur Abd: non tender non distended with active bowel sounds Hip is stable with rom  Right knee moderate medial and lateral joint line tenderness nv intact distally Antalgic gait  Assessment/Plan Assessment: right knee end stage osteoarthritis  Plan: Patient will undergo a right total knee by [facility]. Risks benefits and expectations were discussed with the patient. Patient understand risks, benefits and expectations and wishes to proceed.  Alphonsa OverallBrad Errin Whitelaw PA-C, MPAS [facility]  [facility] [address], [place], [place] [address] Phone: [phone] [email or web address] Facebook  [employer]  LinkedIn  Twitter

## 2017-08-25 DIAGNOSIS — E039 Hypothyroidism, unspecified: Secondary | ICD-10-CM | POA: Diagnosis not present

## 2017-08-29 DIAGNOSIS — Z0000 Encounter for general adult medical examination without abnormal findings: Secondary | ICD-10-CM | POA: Diagnosis not present

## 2017-08-29 DIAGNOSIS — E7800 Pure hypercholesterolemia, unspecified: Secondary | ICD-10-CM | POA: Diagnosis not present

## 2017-08-29 DIAGNOSIS — G4700 Insomnia, unspecified: Secondary | ICD-10-CM | POA: Diagnosis not present

## 2017-08-29 DIAGNOSIS — F419 Anxiety disorder, unspecified: Secondary | ICD-10-CM | POA: Diagnosis not present

## 2017-08-29 DIAGNOSIS — Z23 Encounter for immunization: Secondary | ICD-10-CM | POA: Diagnosis not present

## 2017-08-29 DIAGNOSIS — E039 Hypothyroidism, unspecified: Secondary | ICD-10-CM | POA: Diagnosis not present

## 2017-09-01 NOTE — Pre-Procedure Instructions (Signed)
Amanda Solis  09/01/2017      [id number] [address] [address] Phone: [phone] Fax: [phone]    Your procedure is scheduled on Fri. May 24  Report to [facility] Admitting at 8:00 A.M.   Call this number if you have problems the morning of surgery:  [phone]   Remember:  Do not eat food or drink liquids after midnight on Thurs. May 23   Take these medicines the morning of surgery with A SIP OF WATER : escitalopram (lexapro),levothyroxin (synthroidL,              7 days prior to surgery STOP taking any Aspirin(unless otherwise instructed by your surgeon), Aleve, Naproxen, Ibuprofen, Motrin, Advil, Goody's, BC's, all herbal medications, fish oil, and all vitamins   Do not wear jewelry, make-up or nail polish.  Do not wear lotions, powders, or perfumes, or deodorant.  Do not shave 48 hours prior to surgery.  Men may shave face and neck.  Do not bring valuables to the hospital.  [facility] is not responsible for any belongings or valuables.  Contacts, dentures or bridgework may not be worn into surgery.  Leave your suitcase in the car.  After surgery it may be brought to your room.  For patients admitted to the hospital, discharge time will be determined by your treatment team.  Patients discharged the day of surgery will not be allowed to drive home.    Special instructions:  [place]- Preparing For Surgery  Before surgery, you can play an important role. Because skin is not sterile, your skin needs to be as free of germs as possible. You can reduce the number of germs on your skin by washing with CHG (chlorahexidine gluconate) Soap before surgery.  CHG is an antiseptic cleaner which kills germs and bonds with the skin to continue killing germs even after washing.  Oral Hygiene is also important to reduce your risk of infection.  Remember - BRUSH YOUR TEETH THE MORNING OF  SURGERY  Please do not use if you have an allergy to CHG or antibacterial soaps. If your skin becomes reddened/irritated stop using the CHG.  Do not shave (including legs and underarms) for at least 48 hours prior to first CHG shower. It is OK to shave your face.  Please follow these instructions carefully.   1. Shower the NIGHT BEFORE SURGERY and the MORNING OF SURGERY with CHG.   2. If you chose to wash your hair, wash your hair first as usual with your normal shampoo.  3. After you shampoo, rinse your hair and body thoroughly to remove the shampoo.  4. Use CHG as you would any other liquid soap. You can apply CHG directly to the skin and wash gently with a scrungie or a clean washcloth.   5. Apply the CHG Soap to your body ONLY FROM THE NECK DOWN.  Do not use on open wounds or open sores. Avoid contact with your eyes, ears, mouth and genitals (private parts). Wash Face and genitals (private parts)  with your normal soap.  6. Wash thoroughly, paying special attention to the area where your surgery will be performed.  7. Thoroughly rinse your body with warm water from the neck down.  8. DO NOT shower/wash with your normal soap after using and rinsing off the CHG Soap.  9. Pat yourself dry with a CLEAN TOWEL.  10. Wear CLEAN PAJAMAS to bed  the night before surgery, wear comfortable clothes the morning of surgery  11. Place CLEAN SHEETS on your bed the night of your first shower and DO NOT SLEEP WITH PETS.    Day of Surgery: Do not apply any deodorants/lotions. Please wear clean clothes to the hospital/surgery center.  Remember to brush your teeth.      Please read over the following fact sheets that you were given. Coughing and Deep Breathing, MRSA Information and Surgical Site Infection Prevention

## 2017-09-04 ENCOUNTER — Encounter (HOSPITAL_COMMUNITY)
Admission: RE | Admit: 2017-09-04 | Discharge: 2017-09-04 | Disposition: A | Discharge status: Home / Self Care | Payer: PPO | Source: Ambulatory Visit | Attending: Orthopedic Surgery | Admitting: Orthopedic Surgery

## 2017-09-04 ENCOUNTER — Other Ambulatory Visit: Payer: Self-pay

## 2017-09-04 ENCOUNTER — Encounter (HOSPITAL_COMMUNITY): Payer: Self-pay

## 2017-09-04 DIAGNOSIS — Z01812 Encounter for preprocedural laboratory examination: Secondary | ICD-10-CM | POA: Diagnosis not present

## 2017-09-04 HISTORY — DX: Hypothyroidism, unspecified: E03.9

## 2017-09-04 HISTORY — DX: Headache, unspecified: R51.9

## 2017-09-04 HISTORY — DX: Headache: R51

## 2017-09-04 HISTORY — DX: Anxiety disorder, unspecified: F41.9

## 2017-09-04 HISTORY — DX: Unspecified osteoarthritis, unspecified site: M19.90

## 2017-09-04 LAB — CBC
HCT: 38.4 % (ref 36.0–46.0)
HEMOGLOBIN: 12.3 g/dL (ref 12.0–15.0)
MCH: 29.9 pg (ref 26.0–34.0)
MCHC: 32 g/dL (ref 30.0–36.0)
MCV: 93.4 fL (ref 78.0–100.0)
Platelets: 311 10*3/uL (ref 150–400)
RBC: 4.11 MIL/uL (ref 3.87–5.11)
RDW: 13.1 % (ref 11.5–15.5)
WBC: 5.5 10*3/uL (ref 4.0–10.5)

## 2017-09-04 LAB — SURGICAL PCR SCREEN
MRSA, PCR: NEGATIVE
STAPHYLOCOCCUS AUREUS: POSITIVE — AB

## 2017-09-04 NOTE — Progress Notes (Addendum)
PCP: Horton Marshall, PA @ [facility] [address].  No cardiologist  [facility] [address]

## 2017-09-12 DIAGNOSIS — H2513 Age-related nuclear cataract, bilateral: Secondary | ICD-10-CM | POA: Diagnosis not present

## 2017-09-12 DIAGNOSIS — H524 Presbyopia: Secondary | ICD-10-CM | POA: Diagnosis not present

## 2017-09-12 DIAGNOSIS — H5203 Hypermetropia, bilateral: Secondary | ICD-10-CM | POA: Diagnosis not present

## 2017-09-12 DIAGNOSIS — H52223 Regular astigmatism, bilateral: Secondary | ICD-10-CM | POA: Diagnosis not present

## 2017-09-14 MED ORDER — TRANEXAMIC ACID 1000 MG/10ML IV SOLN
1000.0000 mg | INTRAVENOUS | Status: AC
  Administered 2017-09-15: 1000 mg via INTRAVENOUS
  Filled 2017-09-14: qty 1100

## 2017-09-15 ENCOUNTER — Inpatient Hospital Stay (HOSPITAL_COMMUNITY)
Admission: RE | Admit: 2017-09-15 | Discharge: 2017-09-18 | DRG: 470 | Disposition: A | Discharge status: Home Health | Payer: PPO | Attending: Orthopedic Surgery | Admitting: Orthopedic Surgery

## 2017-09-15 ENCOUNTER — Inpatient Hospital Stay (HOSPITAL_COMMUNITY): Payer: PPO | Admitting: Anesthesiology

## 2017-09-15 ENCOUNTER — Other Ambulatory Visit: Payer: Self-pay

## 2017-09-15 ENCOUNTER — Encounter (HOSPITAL_COMMUNITY): Payer: Self-pay

## 2017-09-15 ENCOUNTER — Encounter (HOSPITAL_COMMUNITY)
Admission: RE | Disposition: A | Discharge status: Home Health | Payer: Self-pay | Source: Home / Self Care | Attending: Orthopedic Surgery

## 2017-09-15 DIAGNOSIS — E785 Hyperlipidemia, unspecified: Secondary | ICD-10-CM | POA: Diagnosis not present

## 2017-09-15 DIAGNOSIS — M1711 Unilateral primary osteoarthritis, right knee: Principal | ICD-10-CM | POA: Diagnosis present

## 2017-09-15 DIAGNOSIS — M25561 Pain in right knee: Secondary | ICD-10-CM | POA: Diagnosis present

## 2017-09-15 DIAGNOSIS — E039 Hypothyroidism, unspecified: Secondary | ICD-10-CM | POA: Diagnosis present

## 2017-09-15 DIAGNOSIS — Z96651 Presence of right artificial knee joint: Secondary | ICD-10-CM

## 2017-09-15 DIAGNOSIS — Z87891 Personal history of nicotine dependence: Secondary | ICD-10-CM

## 2017-09-15 DIAGNOSIS — G8918 Other acute postprocedural pain: Secondary | ICD-10-CM | POA: Diagnosis not present

## 2017-09-15 DIAGNOSIS — Z79899 Other long term (current) drug therapy: Secondary | ICD-10-CM | POA: Diagnosis not present

## 2017-09-15 DIAGNOSIS — Z79890 Hormone replacement therapy (postmenopausal): Secondary | ICD-10-CM

## 2017-09-15 HISTORY — PX: TOTAL KNEE ARTHROPLASTY: SHX125

## 2017-09-15 SURGERY — ARTHROPLASTY, KNEE, TOTAL
Anesthesia: Spinal | Site: Knee | Laterality: Right

## 2017-09-15 MED ORDER — PHENYLEPHRINE 40 MCG/ML (10ML) SYRINGE FOR IV PUSH (FOR BLOOD PRESSURE SUPPORT)
PREFILLED_SYRINGE | INTRAVENOUS | Status: DC | PRN
  Administered 2017-09-15: 80 ug via INTRAVENOUS

## 2017-09-15 MED ORDER — METHOCARBAMOL 500 MG PO TABS
500.0000 mg | ORAL_TABLET | Freq: Four times a day (QID) | ORAL | Status: DC | PRN
  Administered 2017-09-15 – 2017-09-18 (×7): 500 mg via ORAL
  Filled 2017-09-15 (×8): qty 1

## 2017-09-15 MED ORDER — MIDAZOLAM HCL 2 MG/2ML IJ SOLN
INTRAMUSCULAR | Status: AC
  Administered 2017-09-15: 2 mg via INTRAVENOUS
  Filled 2017-09-15: qty 2

## 2017-09-15 MED ORDER — METOCLOPRAMIDE HCL 5 MG/ML IJ SOLN: 5.0000 mg | Freq: Three times a day (TID) | INTRAMUSCULAR | Status: DC | PRN

## 2017-09-15 MED ORDER — NAPROXEN SODIUM 275 MG PO TABS
550.0000 mg | ORAL_TABLET | Freq: Every day | ORAL | Status: DC | PRN
  Administered 2017-09-18: 550 mg via ORAL
  Filled 2017-09-15: qty 2

## 2017-09-15 MED ORDER — FERROUS SULFATE 325 (65 FE) MG PO TABS
325.0000 mg | ORAL_TABLET | Freq: Three times a day (TID) | ORAL | Status: DC
  Administered 2017-09-16 – 2017-09-18 (×8): 325 mg via ORAL
  Filled 2017-09-15 (×8): qty 1

## 2017-09-15 MED ORDER — OXYCODONE-ACETAMINOPHEN 5-325 MG PO TABS: 1.0000 | ORAL_TABLET | ORAL | 0 refills | Status: AC | PRN

## 2017-09-15 MED ORDER — VITAMIN D 1000 UNITS PO TABS
1000.0000 [IU] | ORAL_TABLET | Freq: Every day | ORAL | Status: DC
  Administered 2017-09-16 – 2017-09-18 (×3): 1000 [IU] via ORAL
  Filled 2017-09-15 (×3): qty 1

## 2017-09-15 MED ORDER — GABAPENTIN 300 MG PO CAPS
ORAL_CAPSULE | ORAL | Status: AC
  Administered 2017-09-15: 09:00:00
  Filled 2017-09-15: qty 1

## 2017-09-15 MED ORDER — SODIUM CHLORIDE 0.9 % IR SOLN
Status: DC | PRN
  Administered 2017-09-15: 3000 mL

## 2017-09-15 MED ORDER — SODIUM CHLORIDE 0.9 % IJ SOLN
INTRAMUSCULAR | Status: AC
  Filled 2017-09-15: qty 10

## 2017-09-15 MED ORDER — DEXTROSE 5 % IV SOLN
INTRAVENOUS | Status: DC | PRN
  Administered 2017-09-15: 25 ug/min via INTRAVENOUS

## 2017-09-15 MED ORDER — CEFAZOLIN SODIUM-DEXTROSE 2-4 GM/100ML-% IV SOLN
2.0000 g | INTRAVENOUS | Status: AC
  Administered 2017-09-15: 2 g via INTRAVENOUS

## 2017-09-15 MED ORDER — ONDANSETRON HCL 4 MG/2ML IJ SOLN: 4.0000 mg | Freq: Four times a day (QID) | INTRAMUSCULAR | Status: DC | PRN

## 2017-09-15 MED ORDER — BUPIVACAINE IN DEXTROSE 0.75-8.25 % IT SOLN
INTRATHECAL | Status: DC | PRN
  Administered 2017-09-15: 12 mg via INTRATHECAL

## 2017-09-15 MED ORDER — EPHEDRINE SULFATE-NACL 50-0.9 MG/10ML-% IV SOSY
PREFILLED_SYRINGE | INTRAVENOUS | Status: DC | PRN
  Administered 2017-09-15: 10 mg via INTRAVENOUS

## 2017-09-15 MED ORDER — ACETAMINOPHEN 325 MG PO TABS: 325.0000 mg | ORAL_TABLET | Freq: Four times a day (QID) | ORAL | Status: DC | PRN

## 2017-09-15 MED ORDER — METHOCARBAMOL 500 MG PO TABS: 500.0000 mg | ORAL_TABLET | Freq: Four times a day (QID) | ORAL | 1 refills | Status: DC | PRN

## 2017-09-15 MED ORDER — SIMVASTATIN 20 MG PO TABS
20.0000 mg | ORAL_TABLET | Freq: Every day | ORAL | Status: DC
  Administered 2017-09-16 – 2017-09-17 (×2): 20 mg via ORAL
  Filled 2017-09-15 (×2): qty 1

## 2017-09-15 MED ORDER — TRANEXAMIC ACID 1000 MG/10ML IV SOLN
1000.0000 mg | Freq: Once | INTRAVENOUS | Status: DC
  Filled 2017-09-15: qty 10

## 2017-09-15 MED ORDER — FENTANYL CITRATE (PF) 250 MCG/5ML IJ SOLN
INTRAMUSCULAR | Status: AC
  Filled 2017-09-15: qty 5

## 2017-09-15 MED ORDER — FENTANYL CITRATE (PF) 100 MCG/2ML IJ SOLN
INTRAMUSCULAR | Status: DC | PRN
  Administered 2017-09-15: 50 ug via INTRAVENOUS

## 2017-09-15 MED ORDER — ESCITALOPRAM OXALATE 10 MG PO TABS
10.0000 mg | ORAL_TABLET | Freq: Every day | ORAL | Status: DC
  Administered 2017-09-16: 10 mg via ORAL
  Administered 2017-09-17: 5 mg via ORAL
  Administered 2017-09-18: 10 mg via ORAL
  Filled 2017-09-15 (×3): qty 1

## 2017-09-15 MED ORDER — DOCUSATE SODIUM 100 MG PO CAPS
100.0000 mg | ORAL_CAPSULE | Freq: Two times a day (BID) | ORAL | Status: DC
  Administered 2017-09-15 – 2017-09-18 (×6): 100 mg via ORAL
  Filled 2017-09-15 (×6): qty 1

## 2017-09-15 MED ORDER — FENTANYL CITRATE (PF) 100 MCG/2ML IJ SOLN
50.0000 ug | Freq: Once | INTRAMUSCULAR | Status: AC
  Administered 2017-09-15: 50 ug via INTRAVENOUS

## 2017-09-15 MED ORDER — OXYCODONE HCL 5 MG PO TABS
5.0000 mg | ORAL_TABLET | ORAL | Status: DC | PRN
Start: 2017-09-15 — End: 2017-09-16
  Administered 2017-09-15 – 2017-09-16 (×5): 10 mg via ORAL
  Filled 2017-09-15 (×5): qty 2

## 2017-09-15 MED ORDER — PROPOFOL 500 MG/50ML IV EMUL
INTRAVENOUS | Status: DC | PRN
  Administered 2017-09-15: 100 ug/kg/min via INTRAVENOUS

## 2017-09-15 MED ORDER — METHOCARBAMOL 1000 MG/10ML IJ SOLN
500.0000 mg | Freq: Four times a day (QID) | INTRAVENOUS | Status: DC | PRN
  Filled 2017-09-15: qty 5

## 2017-09-15 MED ORDER — LIDOCAINE 2% (20 MG/ML) 5 ML SYRINGE
INTRAMUSCULAR | Status: AC
  Filled 2017-09-15: qty 5

## 2017-09-15 MED ORDER — MENTHOL 3 MG MT LOZG: 1.0000 | LOZENGE | OROMUCOSAL | Status: DC | PRN

## 2017-09-15 MED ORDER — METOCLOPRAMIDE HCL 5 MG PO TABS: 5.0000 mg | ORAL_TABLET | Freq: Three times a day (TID) | ORAL | Status: DC | PRN

## 2017-09-15 MED ORDER — CEFAZOLIN SODIUM-DEXTROSE 2-4 GM/100ML-% IV SOLN
2.0000 g | Freq: Four times a day (QID) | INTRAVENOUS | Status: AC
  Administered 2017-09-15: 2 g via INTRAVENOUS
  Filled 2017-09-15 (×2): qty 100

## 2017-09-15 MED ORDER — MIDAZOLAM HCL 2 MG/2ML IJ SOLN
2.0000 mg | Freq: Once | INTRAMUSCULAR | Status: AC
Start: 2017-09-15 — End: 2017-09-15
  Administered 2017-09-15: 2 mg via INTRAVENOUS

## 2017-09-15 MED ORDER — CHLORHEXIDINE GLUCONATE 4 % EX LIQD: 60.0000 mL | Freq: Once | CUTANEOUS | Status: DC

## 2017-09-15 MED ORDER — LACTATED RINGERS IV SOLN
INTRAVENOUS | Status: DC
  Administered 2017-09-15: 08:00:00 via INTRAVENOUS

## 2017-09-15 MED ORDER — BISACODYL 10 MG RE SUPP
10.0000 mg | Freq: Every day | RECTAL | Status: DC | PRN
  Administered 2017-09-18: 10 mg via RECTAL
  Filled 2017-09-15: qty 1

## 2017-09-15 MED ORDER — ROPIVACAINE HCL 7.5 MG/ML IJ SOLN
INTRAMUSCULAR | Status: DC | PRN
  Administered 2017-09-15: 20 mL via PERINEURAL

## 2017-09-15 MED ORDER — CEFAZOLIN SODIUM-DEXTROSE 2-4 GM/100ML-% IV SOLN
INTRAVENOUS | Status: AC
  Filled 2017-09-15: qty 100

## 2017-09-15 MED ORDER — LEVOTHYROXINE SODIUM 75 MCG PO TABS
150.0000 ug | ORAL_TABLET | Freq: Every day | ORAL | Status: DC
  Administered 2017-09-16 – 2017-09-18 (×3): 150 ug via ORAL
  Filled 2017-09-15 (×3): qty 2

## 2017-09-15 MED ORDER — SODIUM CHLORIDE 0.9 % IV SOLN
INTRAVENOUS | Status: DC
  Administered 2017-09-15 – 2017-09-16 (×2): via INTRAVENOUS

## 2017-09-15 MED ORDER — HYDROMORPHONE HCL 2 MG/ML IJ SOLN
INTRAMUSCULAR | Status: AC
  Administered 2017-09-15: 0.5 mg via INTRAVENOUS
  Filled 2017-09-15: qty 1

## 2017-09-15 MED ORDER — 0.9 % SODIUM CHLORIDE (POUR BTL) OPTIME
TOPICAL | Status: DC | PRN
  Administered 2017-09-15: 1000 mL

## 2017-09-15 MED ORDER — PHENOL 1.4 % MT LIQD
1.0000 | OROMUCOSAL | Status: DC | PRN
Start: 2017-09-15 — End: 2017-09-18

## 2017-09-15 MED ORDER — HYDROMORPHONE HCL 2 MG/ML IJ SOLN
0.2500 mg | INTRAMUSCULAR | Status: DC | PRN
  Administered 2017-09-15 (×4): 0.5 mg via INTRAVENOUS

## 2017-09-15 MED ORDER — POLYETHYLENE GLYCOL 3350 17 G PO PACK
17.0000 g | PACK | Freq: Every day | ORAL | Status: DC | PRN
  Administered 2017-09-18: 17 g via ORAL
  Filled 2017-09-15: qty 1

## 2017-09-15 MED ORDER — ASPIRIN 81 MG PO CHEW
81.0000 mg | CHEWABLE_TABLET | Freq: Two times a day (BID) | ORAL | Status: DC
  Administered 2017-09-15 – 2017-09-18 (×6): 81 mg via ORAL
  Filled 2017-09-15 (×6): qty 1

## 2017-09-15 MED ORDER — EPHEDRINE SULFATE 50 MG/ML IJ SOLN
INTRAMUSCULAR | Status: AC
  Filled 2017-09-15: qty 1

## 2017-09-15 MED ORDER — ADULT MULTIVITAMIN W/MINERALS CH
1.0000 | ORAL_TABLET | Freq: Every day | ORAL | Status: DC
  Administered 2017-09-16 – 2017-09-18 (×3): 1 via ORAL
  Filled 2017-09-15 (×3): qty 1

## 2017-09-15 MED ORDER — ONDANSETRON HCL 4 MG PO TABS: 4.0000 mg | ORAL_TABLET | Freq: Four times a day (QID) | ORAL | Status: DC | PRN

## 2017-09-15 MED ORDER — ONDANSETRON HCL 4 MG/2ML IJ SOLN
INTRAMUSCULAR | Status: DC | PRN
  Administered 2017-09-15: 4 mg via INTRAVENOUS

## 2017-09-15 MED ORDER — ACETAMINOPHEN 500 MG PO TABS
ORAL_TABLET | ORAL | Status: AC
  Administered 2017-09-15: 1000 mg
  Filled 2017-09-15: qty 2

## 2017-09-15 MED ORDER — ASPIRIN 81 MG PO CHEW: 81.0000 mg | CHEWABLE_TABLET | Freq: Two times a day (BID) | ORAL | 0 refills | Status: DC

## 2017-09-15 MED ORDER — HYDROMORPHONE HCL 2 MG/ML IJ SOLN
0.5000 mg | INTRAMUSCULAR | Status: DC | PRN
Start: 2017-09-15 — End: 2017-09-16
  Administered 2017-09-15 – 2017-09-16 (×5): 1 mg via INTRAVENOUS
  Filled 2017-09-15 (×5): qty 1

## 2017-09-15 MED ORDER — DEXAMETHASONE SODIUM PHOSPHATE 10 MG/ML IJ SOLN
INTRAMUSCULAR | Status: DC | PRN
  Administered 2017-09-15: 5 mg via INTRAVENOUS

## 2017-09-15 MED ORDER — PROPOFOL 1000 MG/100ML IV EMUL
INTRAVENOUS | Status: AC
  Filled 2017-09-15: qty 100

## 2017-09-15 MED ORDER — FENTANYL CITRATE (PF) 100 MCG/2ML IJ SOLN
INTRAMUSCULAR | Status: AC
  Administered 2017-09-15: 50 ug via INTRAVENOUS
  Filled 2017-09-15: qty 2

## 2017-09-15 SURGICAL SUPPLY — 56 items
BANDAGE ELASTIC 6 VELCRO ST LF (GAUZE/BANDAGES/DRESSINGS) ×1 IMPLANT
BLADE SAG 18X100X1.27 (BLADE) ×2 IMPLANT
BLADE SAW SGTL 13X75X1.27 (BLADE) ×2 IMPLANT
BOWL SMART MIX CTS (DISPOSABLE) ×2 IMPLANT
CAP KNEE TOTAL 3 SIGMA ×1 IMPLANT
CEMENT HV SMART SET (Cement) ×4 IMPLANT
COVER SURGICAL LIGHT HANDLE (MISCELLANEOUS) ×2 IMPLANT
CUFF TOURNIQUET SINGLE 34IN LL (TOURNIQUET CUFF) ×1 IMPLANT
DRAPE EXTREMITY T 121X128X90 (DRAPE) ×2 IMPLANT
DRAPE HALF SHEET 40X57 (DRAPES) ×2 IMPLANT
DRAPE U-SHAPE 47X51 STRL (DRAPES) ×2 IMPLANT
DRSG ADAPTIC 3X8 NADH LF (GAUZE/BANDAGES/DRESSINGS) ×2 IMPLANT
DRSG PAD ABDOMINAL 8X10 ST (GAUZE/BANDAGES/DRESSINGS) ×4 IMPLANT
DURAPREP 26ML APPLICATOR (WOUND CARE) ×2 IMPLANT
ELECT CAUTERY BLADE 6.4 (BLADE) ×2 IMPLANT
ELECT REM PT RETURN 9FT ADLT (ELECTROSURGICAL) ×2
ELECTRODE REM PT RTRN 9FT ADLT (ELECTROSURGICAL) ×1 IMPLANT
GAUZE SPONGE 4X4 12PLY STRL (GAUZE/BANDAGES/DRESSINGS) ×2 IMPLANT
GLOVE BIO SURGEON STRL SZ 6 (GLOVE) ×1 IMPLANT
GLOVE BIOGEL PI IND STRL 6.5 (GLOVE) IMPLANT
GLOVE BIOGEL PI IND STRL 7.5 (GLOVE) IMPLANT
GLOVE BIOGEL PI INDICATOR 6.5 (GLOVE) ×3
GLOVE BIOGEL PI INDICATOR 7.5 (GLOVE) ×1
GLOVE BIOGEL PI ORTHO PRO 7.5 (GLOVE) ×1
GLOVE BIOGEL PI ORTHO PRO SZ8 (GLOVE) ×1
GLOVE ECLIPSE 8.0 STRL XLNG CF (GLOVE) ×1 IMPLANT
GLOVE ORTHO TXT STRL SZ7.5 (GLOVE) ×2 IMPLANT
GLOVE PI ORTHO PRO STRL 7.5 (GLOVE) ×1 IMPLANT
GLOVE PI ORTHO PRO STRL SZ8 (GLOVE) ×1 IMPLANT
GLOVE SURG ORTHO 8.5 STRL (GLOVE) ×2 IMPLANT
GOWN STRL REUS W/ TWL XL LVL3 (GOWN DISPOSABLE) ×3 IMPLANT
GOWN STRL REUS W/TWL XL LVL3 (GOWN DISPOSABLE) ×8
HANDPIECE INTERPULSE COAX TIP (DISPOSABLE) ×2
IMMOBILIZER KNEE 22 UNIV (SOFTGOODS) ×1 IMPLANT
KIT BASIN OR (CUSTOM PROCEDURE TRAY) ×2 IMPLANT
KIT MANIFOLD (MISCELLANEOUS) ×2 IMPLANT
KIT TURNOVER KIT B (KITS) ×2 IMPLANT
MANIFOLD NEPTUNE II (INSTRUMENTS) ×2 IMPLANT
NS IRRIG 1000ML POUR BTL (IV SOLUTION) ×2 IMPLANT
PACK TOTAL JOINT (CUSTOM PROCEDURE TRAY) ×2 IMPLANT
PAD ARMBOARD 7.5X6 YLW CONV (MISCELLANEOUS) ×4 IMPLANT
SET HNDPC FAN SPRY TIP SCT (DISPOSABLE) ×1 IMPLANT
STRIP CLOSURE SKIN 1/2X4 (GAUZE/BANDAGES/DRESSINGS) ×4 IMPLANT
SUCTION FRAZIER HANDLE 10FR (MISCELLANEOUS) ×1
SUCTION TUBE FRAZIER 10FR DISP (MISCELLANEOUS) ×1 IMPLANT
SUT MNCRL AB 3-0 PS2 18 (SUTURE) ×1 IMPLANT
SUT VIC AB 0 CT1 27 (SUTURE) ×2
SUT VIC AB 0 CT1 27XBRD ANBCTR (SUTURE) ×2 IMPLANT
SUT VIC AB 1 CT1 27 (SUTURE) ×10
SUT VIC AB 1 CT1 27XBRD ANBCTR (SUTURE) ×3 IMPLANT
SUT VIC AB 2-0 CT1 27 (SUTURE) ×4
SUT VIC AB 2-0 CT1 TAPERPNT 27 (SUTURE) ×2 IMPLANT
TOWEL OR 17X24 6PK STRL BLUE (TOWEL DISPOSABLE) ×2 IMPLANT
TOWEL OR 17X26 10 PK STRL BLUE (TOWEL DISPOSABLE) ×2 IMPLANT
TRAY CATH 16FR W/PLASTIC CATH (SET/KITS/TRAYS/PACK) IMPLANT
TRAY FOLEY MTR SLVR 16FR STAT (SET/KITS/TRAYS/PACK) ×1 IMPLANT

## 2017-09-15 NOTE — Anesthesia Procedure Notes (Signed)
Anesthesia Regional Block: Adductor canal block   Pre-Anesthetic Checklist: ,, timeout performed, Correct Patient, Correct Site, Correct Laterality, Correct Procedure, Correct Position, site marked, Risks and benefits discussed, pre-op evaluation,  At surgeon's request and post-op pain management  Laterality: Right  Prep: Maximum Sterile Barrier Precautions used, chloraprep       Needles:  Injection technique: Single-shot  Needle Type: Echogenic Stimulator Needle     Needle Length: 9cm  Needle Gauge: 21     Additional Needles:   Procedures:,,,, ultrasound used (permanent image in chart),,,,  Narrative:  Start time: 09/15/2017 8:55 AM End time: 09/15/2017 9:05 AM Injection made incrementally with aspirations every 5 mL.  Performed by: Personally  Anesthesiologist: Gaynelle Adu, MD  Additional Notes: 2% Lidocaine skin wheel.

## 2017-09-15 NOTE — Progress Notes (Signed)
Orthopedic Tech Progress Note Patient Details:  Amanda Solis [facility] 1950-05-22 [id number]  CPM Right Knee CPM Right Knee: On Right Knee Flexion (Degrees): 90 Right Knee Extension (Degrees): 0 Additional Comments: foot roll  Post Interventions Patient Tolerated: Well Instructions Provided: Care of device  [name] 09/15/2017, 1:42 PM

## 2017-09-15 NOTE — Anesthesia Procedure Notes (Signed)
Procedure Name: MAC Date/Time: 09/15/2017 10:25 AM Performed by: Kyung Rudd, CRNA Pre-anesthesia Checklist: Patient identified, Emergency Drugs available, Suction available and Patient being monitored Patient Re-evaluated:Patient Re-evaluated prior to induction Oxygen Delivery Method: Simple face mask Induction Type: IV induction Placement Confirmation: positive ETCO2

## 2017-09-15 NOTE — Op Note (Signed)
NAME: Amanda Solis, Amanda Solis MEDICAL RECORD RU:[id number] ACCOUNT [id number] DATE OF BIRTH:24-Jun-1950 FACILITY: [facility] LOCATION: [facility] PHYSICIAN:STEVEN Russ Halo, MD  OPERATIVE REPORT  DATE OF PROCEDURE:  09/15/2017  PREOPERATIVE DIAGNOSIS:  Right knee end-stage osteoarthritis.  POSTOPERATIVE DIAGNOSIS:  Right knee end-stage osteoarthritis.  PROCEDURE PERFORMED:  Right total knee replacement using DePuy Sigma rotating platform prosthesis.  ATTENDING SURGEON:  Malon Kindle, MD  ASSISTANT:  Standley Dakins, PA-C, who was scrubbed during the entire procedure and necessary for satisfactory completion of the surgery.  ANESTHESIA:  Spinal anesthesia was used plus adductor canal block.  ESTIMATED BLOOD LOSS:  Minimal.  FLUID REPLACEMENT:  1500 mL crystalloid.    INSTRUMENT COUNTS:  Correct.    COMPLICATIONS:  There were no complications.    ANTIBIOTICS:  Perioperative antibiotics were given.  INDICATIONS:  The patient is a 67 year old female with worsening right knee pain secondary to end-stage arthritis.  The patient has failed an extended period of conservative management and desires operative treatment to a store functional and eliminate  pain to her knee.  Informed consent obtained.  DESCRIPTION OF PROCEDURE:  After adequate level of anesthesia was achieved, the patient was positioned supine on the operating room table.  Nonsterile tourniquet was placed on right proximal thigh.  Right leg sterilely prepped and draped in the usual  manner.  Timeout called.  We tested the patient's spinal block to make sure that was working prior to skin incision.  We elevated the leg, exsanguinated with Esmarch bandage and then elevated the tourniquet to 300 mmHg.  With the knee in flexion, we did  a longitudinal midline incision, medial parapatellar arthrotomy done with a fresh 10 blade scalpel.  Lateral patellofemoral ligament was divided.  The knee was again flexed with the patella everted.  We  then entered the distal femur, which had obvious  full thickness cartilage loss.  We entered with a step cut drill into the intramedullary canal.  We then placed our intramedullary resection guide and resected 10 mm off the affected right distal femur, set on 5 degrees right.  Once we had our distal  femoral resection performed, we sized the femur to a size 2.5 anterior down, performed anterior, posterior and chamfer cuts with a 4-in-1 block.  We then resected  ACL and PCL meniscal tissues.  We next cut our tibia.  We did a 90 degree perpendicular  cut with minimal posterior slope for this posterior cruciate substituting prosthesis.  Using oscillating saw on the external alignment jig, we did remove 2 mm of bone off the affected medial side.  We checked our flexion and extension gaps where flexion  gap was good with 10 mm.  Unfortunately, we could not get our extension block in the 10 extension blocks.  We did go ahead and decided to cut the distal femur again 2 more mm off the distal femur using the angel wing and repinning and then taking 2  additional millimeters with setting the block back on the pins and repinning.  We then did our chamfer cuts, again.  Anterior and posterior were fine and we rechecked our gaps, which were symmetric at 10 mm.  Next, we went ahead and irrigated thoroughly.   We used a lamina spreader to remove the excess posterior bone off the posterior femoral condyle.  Then we went ahead and made sure that all meniscal tissue and cruciate ligaments were out.  We then went to complete our tibial preparation with a modular  drill and keel punch and  then we completed our femoral preparation with the box cut guide for the size 2.5 and we had a 2.5 tibia as well.  We had our trial components in.  We took a 10 mm poly insert and placed that and we were able to get the knee  reduced into extension.  We then resurfaced the patella going from 22-23 mm thickness down to 15 and drilled lug holes  and placed our 35 patellar button in place.  We ranged the knee, had a little bit of lateral patellar subluxation.  We went ahead and  did a lateral release and I fixed that.  We irrigated thoroughly, removed all the trial components and then pulse irrigated the bone and then dried really well and then we cemented the components into place with vacuum mixed high viscosity cement, DePuy.   Once we had those components in place and cemented in place the knee in extension with a 10 mm poly insert, held until all the poly was hardened, we used a patellar clamp for the patella.  We removed excess cement with 1/4 inch curved osteotome.  We  were pleased with the component position selected the real size 2.5+10 poly and placed it onto the tibial tray and then reduced the knee and nice little pop there medially.  We were able to get to full extension, nice flexion and stability. The patellar  tracking was excellent with the release.  We irrigated thoroughly and performed our primary repair of the medial parapatellar arthrotomy with #1 Vicryl suture, followed by 2-0 Vicryl for subcutaneous closure, 4-0 Monocryl for skin.  Steri-Strips were  applied followed by sterile dressing.    The patient tolerated the surgery well.  AN/NUANCE  D:09/15/2017 T:09/15/2017 JOB:[id number]

## 2017-09-15 NOTE — Discharge Instructions (Signed)
Ok to put full weight on the right leg.  Do exercises every hour while awake.  Prop under the ankle NOT under the knee.  Use the knee immobilizer on at night to maintain a straight leg(extension).  Keep the incision clean and dry and covered for one week, then ok to get it wet in the shower.  Follow up with Dr Ranell Patrick in two week in the office, call [phone] for appt  Wear the stockings on both legs and take the baby aspirin (chew tabs) twice a day for 30 days to prevent blood clots.

## 2017-09-15 NOTE — Interval H&P Note (Signed)
History and Physical Interval Note:  09/15/2017 9:38 AM  Amanda Solis  has presented today for surgery, with the diagnosis of Right knee end stage osteoarthritis  The various methods of treatment have been discussed with the patient and family. After consideration of risks, benefits and other options for treatment, the patient has consented to  Procedure(s): RIGHT TOTAL KNEE ARTHROPLASTY (Right) as a surgical intervention .  The patient's history has been reviewed, patient examined, no change in status, stable for surgery.  I have reviewed the patient's chart and labs.  Questions were answered to the patient's satisfaction.     Khadim Lundberg,STEVEN R

## 2017-09-15 NOTE — Anesthesia Procedure Notes (Signed)
Spinal  Patient location during procedure: OR Start time: 09/15/2017 10:10 AM End time: 09/15/2017 10:20 AM Staffing Anesthesiologist: Gaynelle Adu, MD Performed: anesthesiologist  Preanesthetic Checklist Completed: patient identified, surgical consent, pre-op evaluation, timeout performed, IV checked, risks and benefits discussed and monitors and equipment checked Spinal Block Patient position: sitting Prep: DuraPrep Patient monitoring: cardiac monitor, continuous pulse ox and blood pressure Approach: midline Location: L2-3 Injection technique: single-shot Needle Needle type: Pencan  Needle gauge: 24 G Needle length: 9 cm Assessment Sensory level: T8 Additional Notes Functioning IV was confirmed and monitors were applied. Sterile prep and drape, including hand hygiene and sterile gloves were used. The patient was positioned and the spine was prepped. The skin was anesthetized with lidocaine.  Free flow of clear CSF was obtained prior to injecting local anesthetic into the CSF.  The spinal needle aspirated freely following injection.  The needle was carefully withdrawn.  The patient tolerated the procedure well.

## 2017-09-15 NOTE — Brief Op Note (Signed)
09/15/2017  12:36 PM  PATIENT:  Amanda Solis  67 y.o. female  PRE-OPERATIVE DIAGNOSIS:  Right knee end stage osteoarthritis, end stage  POST-OPERATIVE DIAGNOSIS:  Right knee end stage osteoarthritis, end stage  PROCEDURE:  Procedure(s): RIGHT TOTAL KNEE ARTHROPLASTY (Right)  DePuy Sigma RP   SURGEON:  Surgeon(s) and Role:    Beverely Low, MD - Primary  PHYSICIAN ASSISTANT:   ASSISTANTS: Thea Gist, PA-C   ANESTHESIA:   Spinal and Adductor Canal block  EBL:  250 mL   BLOOD ADMINISTERED:none  DRAINS: none   LOCAL MEDICATIONS USED:  NONE  SPECIMEN:  No Specimen  DISPOSITION OF SPECIMEN:  N/A  COUNTS:  YES  TOURNIQUET:   Total Tourniquet Time Documented: Thigh (Right) - 101 minutes Total: Thigh (Right) - 101 minutes   DICTATION: .Other Dictation: Dictation Number [id number]  PLAN OF CARE: Admit to inpatient   PATIENT DISPOSITION:  PACU - hemodynamically stable.   Delay start of Pharmacological VTE agent (>24hrs) due to surgical blood loss or risk of bleeding: no

## 2017-09-15 NOTE — Anesthesia Preprocedure Evaluation (Addendum)
Anesthesia Evaluation  Patient identified by MRN, date of birth, ID band Patient awake    Reviewed: Allergy & Precautions, H&P , NPO status , Patient's Chart, lab work & pertinent test results  Airway Mallampati: II  TM Distance: >3 FB Neck ROM: Full    Dental no notable dental hx. (+) Teeth Intact, Dental Advisory Given   Pulmonary neg pulmonary ROS, former smoker,    Pulmonary exam normal breath sounds clear to auscultation       Cardiovascular negative cardio ROS   Rhythm:Regular Rate:Normal     Neuro/Psych  Headaches, Anxiety    GI/Hepatic negative GI ROS, Neg liver ROS,   Endo/Other  Hypothyroidism   Renal/GU negative Renal ROS  negative genitourinary   Musculoskeletal  (+) Arthritis , Osteoarthritis,    Abdominal   Peds  Hematology negative hematology ROS (+)   Anesthesia Other Findings   Reproductive/Obstetrics negative OB ROS                            Anesthesia Physical Anesthesia Plan  ASA: II  Anesthesia Plan: Spinal   Post-op Pain Management:  Regional for Post-op pain   Induction: Intravenous  PONV Risk Score and Plan: 3 and Ondansetron, Propofol infusion, Midazolam and Dexamethasone  Airway Management Planned: Simple Face Mask  Additional Equipment:   Intra-op Plan:   Post-operative Plan:   Informed Consent: I have reviewed the patients History and Physical, chart, labs and discussed the procedure including the risks, benefits and alternatives for the proposed anesthesia with the patient or authorized representative who has indicated his/her understanding and acceptance.   Dental advisory given  Plan Discussed with: CRNA  Anesthesia Plan Comments:         Anesthesia Quick Evaluation

## 2017-09-15 NOTE — Evaluation (Signed)
Physical Therapy Evaluation Patient Details Name: Amanda Solis MRN: [id number] DOB: 1950-05-05 Today's Date: 09/15/2017   History of Present Illness  67 y.o. female s/p R TKA 09/15/17.   Clinical Impression  Patient is s/p above surgery resulting in functional limitations due to the deficits listed below (see PT Problem List). PTA, pt living with husband in 2 story home, independent with mobility and ADLs. Upon eval pt presents with post op pain and weakness and short term memory issues requiring additional cues throughout session. Min A level for transfers and short gait in room, deferred progression 2/2 to pain. Anticipate patient may be need several PT sessions to progress to safe stair ambulation for home.  Patient will benefit from skilled PT to increase their independence and safety with mobility to allow discharge to the venue listed below.       Follow Up Recommendations Follow surgeon's recommendation for DC plan and follow-up therapies;Supervision for mobility/OOB;Home health PT    Equipment Recommendations  None recommended by PT    Recommendations for Other Services       Precautions / Restrictions Precautions Precautions: None;Knee Precaution Booklet Issued: Yes (comment) Precaution Comments: no pillow under knee, supine therex Restrictions Weight Bearing Restrictions: Yes      Mobility  Bed Mobility Overal bed mobility: Needs Assistance Bed Mobility: Supine to Sit;Sit to Supine     Supine to sit: Supervision Sit to supine: Min assist   General bed mobility comments: min A to bring legs over EOB  Transfers Overall transfer level: Needs assistance Equipment used: Rolling walker (2 wheeled) Transfers: Sit to/from Stand Sit to Stand: Min assist         General transfer comment: min A to power up, cues for hand placement  Ambulation/Gait Ambulation/Gait assistance: Min guard;Min assist Ambulation Distance (Feet): 15 Feet Assistive device: Rolling  walker (2 wheeled) Gait Pattern/deviations: Step-to pattern Gait velocity: decreased   General Gait Details: slow and painful, patient needs additonal cueing due to short term memory issues and confusion tonight, husband present with cues for sequenceing as well. Patient min guard, interrmitent min A to provide stability at times.   Stairs            Wheelchair Mobility    Modified Rankin (Stroke Patients Only)       Balance Overall balance assessment: Needs assistance   Sitting balance-Leahy Scale: Good       Standing balance-Leahy Scale: Fair                               Pertinent Vitals/Pain Pain Assessment: 0-10 Pain Score: 5  Pain Location: R knee  Pain Descriptors / Indicators: Discomfort;Aching Pain Intervention(s): Limited activity within patient's tolerance;Monitored during session;Premedicated before session;Repositioned    Home Living Family/patient expects to be discharged to:: Private residence Living Arrangements: Spouse/significant other Available Help at Discharge: Available 24 hours/day Type of Home: House Home Access: Stairs to enter Entrance Stairs-Rails: Left Entrance Stairs-Number of Steps: 4 Home Layout: One level Home Equipment: Walker - 2 wheels;Cane - quad      Prior Function Level of Independence: Independent         Comments: driving, retired.      Hand Dominance        Extremity/Trunk Assessment   Upper Extremity Assessment Upper Extremity Assessment: Overall WFL for tasks assessed    Lower Extremity Assessment Lower Extremity Assessment: Overall WFL for tasks assessed  Communication   Communication: No difficulties  Cognition Arousal/Alertness: Awake/alert Behavior During Therapy: WFL for tasks assessed/performed Overall Cognitive Status: Within Functional Limits for tasks assessed                                        General Comments      Exercises Total Joint  Exercises Ankle Circles/Pumps: 20 reps Quad Sets: 10 reps Heel Slides: 10 reps   Assessment/Plan    PT Assessment Patient needs continued PT services  PT Problem List Decreased strength;Decreased range of motion;Decreased activity tolerance;Decreased balance;Decreased mobility;Pain       PT Treatment Interventions DME instruction;Gait training;Stair training;Functional mobility training;Therapeutic activities;Therapeutic exercise;Balance training    PT Goals (Current goals can be found in the Care Plan section)  Acute Rehab PT Goals Patient Stated Goal: less pain, walk up stairs at home  PT Goal Formulation: With patient Time For Goal Achievement: 09/22/17 Potential to Achieve Goals: Good    Frequency 7X/week   Barriers to discharge        Co-evaluation               AM-PAC PT "6 Clicks" Daily Activity  Outcome Measure Difficulty turning over in bed (including adjusting bedclothes, sheets and blankets)?: Unable Difficulty moving from lying on back to sitting on the side of the bed? : Unable Difficulty sitting down on and standing up from a chair with arms (e.g., wheelchair, bedside commode, etc,.)?: Unable Help needed moving to and from a bed to chair (including a wheelchair)?: A Little Help needed walking in hospital room?: A Little Help needed climbing 3-5 steps with a railing? : A Lot 6 Click Score: 11    End of Session Equipment Utilized During Treatment: Gait belt Activity Tolerance: Patient tolerated treatment well Patient left: in bed;with call bell/phone within reach;with family/visitor present Nurse Communication: Mobility status PT Visit Diagnosis: Unsteadiness on feet (R26.81);Pain;Difficulty in walking, not elsewhere classified (R26.2) Pain - Right/Left: Right Pain - part of body: Knee    Time: 1610-9604 PT Time Calculation (min) (ACUTE ONLY): 32 min   Charges:   PT Evaluation $PT Eval Low Complexity: 1 Low PT Treatments $Gait Training: 8-22  mins   PT G Codes:        Etta Grandchild, PT, DPT Acute Rehab Services Pager: [phone]    Etta Grandchild 09/15/2017, 8:23 PM

## 2017-09-15 NOTE — Anesthesia Postprocedure Evaluation (Signed)
Anesthesia Post Note  Patient: Amanda Solis  Procedure(s) Performed: RIGHT TOTAL KNEE ARTHROPLASTY (Right Knee)     Patient location during evaluation: PACU Anesthesia Type: Spinal and Regional Level of consciousness: oriented and awake and alert Pain management: pain level controlled Vital Signs Assessment: post-procedure vital signs reviewed and stable Respiratory status: spontaneous breathing and respiratory function stable Cardiovascular status: blood pressure returned to baseline and stable Postop Assessment: no headache, no backache, no apparent nausea or vomiting, patient able to bend at knees and spinal receding Anesthetic complications: no    Last Vitals:  Vitals:   09/15/17 1320 09/15/17 1335  BP: 112/70 (!) 111/57  Pulse: 80 72  Resp: 19 20  Temp:  36.4 C  SpO2: 100% 100%    Last Pain:  Vitals:   09/15/17 1335  TempSrc:   PainSc: 0-No pain                 Delmos Velaquez,W. EDMOND

## 2017-09-15 NOTE — Transfer of Care (Signed)
Immediate Anesthesia Transfer of Care Note  Patient: Amanda Solis  Procedure(s) Performed: RIGHT TOTAL KNEE ARTHROPLASTY (Right Knee)  Patient Location: PACU  Anesthesia Type:Spinal  Level of Consciousness: awake, alert  and oriented  Airway & Oxygen Therapy: Patient Spontanous Breathing and Patient connected to nasal cannula oxygen  Post-op Assessment: Report given to RN and Post -op Vital signs reviewed and stable  Post vital signs: Reviewed and stable  Last Vitals:  Vitals Value Taken Time  BP 108/63 09/15/2017 12:36 PM  Temp    Pulse 89 09/15/2017 12:38 PM  Resp 19 09/15/2017 12:38 PM  SpO2 100 % 09/15/2017 12:38 PM  Vitals shown include unvalidated device data.  Last Pain:  Vitals:   09/15/17 0816  TempSrc:   PainSc: 0-No pain         Complications: No apparent anesthesia complications

## 2017-09-16 LAB — CBC
HCT: 33.9 % — ABNORMAL LOW (ref 36.0–46.0)
Hemoglobin: 10.7 g/dL — ABNORMAL LOW (ref 12.0–15.0)
MCH: 29 pg (ref 26.0–34.0)
MCHC: 31.6 g/dL (ref 30.0–36.0)
MCV: 91.9 fL (ref 78.0–100.0)
PLATELETS: 255 10*3/uL (ref 150–400)
RBC: 3.69 MIL/uL — ABNORMAL LOW (ref 3.87–5.11)
RDW: 12.2 % (ref 11.5–15.5)
WBC: 7.7 10*3/uL (ref 4.0–10.5)

## 2017-09-16 LAB — BASIC METABOLIC PANEL
ANION GAP: 10 (ref 5–15)
BUN: 12 mg/dL (ref 6–20)
CO2: 26 mmol/L (ref 22–32)
Calcium: 8.4 mg/dL — ABNORMAL LOW (ref 8.9–10.3)
Chloride: 100 mmol/L — ABNORMAL LOW (ref 101–111)
Creatinine, Ser: 0.78 mg/dL (ref 0.44–1.00)
GFR calc Af Amer: >60 mL/min (ref >60)
GLUCOSE: 114 mg/dL — AB (ref 65–99)
Potassium: 3.5 mmol/L (ref 3.5–5.1)
Sodium: 136 mmol/L (ref 135–145)

## 2017-09-16 MED ORDER — ACETAMINOPHEN 500 MG PO TABS
1000.0000 mg | ORAL_TABLET | Freq: Two times a day (BID) | ORAL | Status: DC
  Administered 2017-09-16 – 2017-09-18 (×4): 1000 mg via ORAL
  Filled 2017-09-16 (×4): qty 2

## 2017-09-16 MED ORDER — OXYCODONE HCL 5 MG PO TABS
5.0000 mg | ORAL_TABLET | ORAL | Status: DC | PRN
  Administered 2017-09-16: 5 mg via ORAL
  Administered 2017-09-17 (×2): 10 mg via ORAL
  Administered 2017-09-17 – 2017-09-18 (×2): 5 mg via ORAL
  Filled 2017-09-16 (×3): qty 2
  Filled 2017-09-16 (×2): qty 1
  Filled 2017-09-16: qty 2

## 2017-09-16 MED ORDER — MORPHINE SULFATE (PF) 2 MG/ML IV SOLN
2.0000 mg | INTRAVENOUS | Status: DC | PRN
  Administered 2017-09-16: 2 mg via INTRAVENOUS
  Filled 2017-09-16: qty 1

## 2017-09-16 NOTE — Progress Notes (Signed)
Physical Therapy Treatment Patient Details Name: Amanda Solis MRN: [id number] DOB: [birth year] Today's Date: 09/16/2017    History of Present Illness 67 y.o. female s/p R TKA 09/15/17.     PT Comments    Patient progressing very slowly with therapy. Unable to progress ambulation this visit due to high levels of pain. Transferred to chair with min A at all times for stabilization, moderate cueing for sequencing. Pt reports "I could pass out it hurts so bad". Notified RN. Husband not present this session, will see again this afternoon and progress as tolerated.     Follow Up Recommendations  Follow surgeon's recommendation for DC plan and follow-up therapies;Supervision for mobility/OOB;Home health PT     Equipment Recommendations  None recommended by PT    Recommendations for Other Services       Precautions / Restrictions Precautions Precautions: None;Knee Precaution Booklet Issued: Yes (comment) Precaution Comments: no pillow under knee, supine therex Restrictions Weight Bearing Restrictions: Yes RLE Weight Bearing: Weight bearing as tolerated    Mobility  Bed Mobility Overal bed mobility: Needs Assistance Bed Mobility: Supine to Sit;Sit to Supine     Supine to sit: Supervision Sit to supine: Min assist   General bed mobility comments: min A to bring legs over EOB  Transfers Overall transfer level: Needs assistance Equipment used: Rolling walker (2 wheeled) Transfers: Sit to/from Stand Sit to Stand: Min assist         General transfer comment: min A to power up, cues for hand placement  Ambulation/Gait             General Gait Details: unable to due to pain   Stairs             Wheelchair Mobility    Modified Rankin (Stroke Patients Only)       Balance Overall balance assessment: Needs assistance   Sitting balance-Leahy Scale: Good       Standing balance-Leahy Scale: Fair                               Cognition Arousal/Alertness: Awake/alert Behavior During Therapy: WFL for tasks assessed/performed Overall Cognitive Status: Within Functional Limits for tasks assessed                                        Exercises      General Comments        Pertinent Vitals/Pain Pain Assessment: 0-10 Pain Score: 5  Pain Location: R knee  Pain Descriptors / Indicators: Discomfort;Aching Pain Intervention(s): Limited activity within patient's tolerance;Monitored during session;Premedicated before session;Repositioned    Home Living                      Prior Function            PT Goals (current goals can now be found in the care plan section) Acute Rehab PT Goals Patient Stated Goal: less pain, walk up stairs at home  PT Goal Formulation: With patient Time For Goal Achievement: 09/22/17 Potential to Achieve Goals: Good    Frequency    7X/week      PT Plan Current plan remains appropriate    Co-evaluation              AM-PAC PT "6 Clicks" Daily Activity  Outcome Measure  Difficulty turning over  in bed (including adjusting bedclothes, sheets and blankets)?: Unable Difficulty moving from lying on back to sitting on the side of the bed? : Unable Difficulty sitting down on and standing up from a chair with arms (e.g., wheelchair, bedside commode, etc,.)?: Unable Help needed moving to and from a bed to chair (including a wheelchair)?: A Little Help needed walking in hospital room?: A Little Help needed climbing 3-5 steps with a railing? : A Lot 6 Click Score: 11    End of Session Equipment Utilized During Treatment: Gait belt Activity Tolerance: Patient tolerated treatment well Patient left: in bed;with call bell/phone within reach;with family/visitor present Nurse Communication: Mobility status PT Visit Diagnosis: Unsteadiness on feet (R26.81);Pain;Difficulty in walking, not elsewhere classified (R26.2) Pain - Right/Left: Right Pain -  part of body: Knee     Time: 9604-5409 PT Time Calculation (min) (ACUTE ONLY): 15 min  Charges:  $Therapeutic Activity: 8-22 mins                    G Codes:      Etta Grandchild, PT, DPT Acute Rehab Services Pager: [phone]     Etta Grandchild 09/16/2017, 8:44 AM

## 2017-09-16 NOTE — Progress Notes (Signed)
Subjective: 1 Day Post-Op Procedure(s) (LRB): RIGHT TOTAL KNEE ARTHROPLASTY (Right) Patient reports pain as 7 on 0-10 scale.  Tolerating Po's. Progress with PT. Denies SOb or CP.  Objective: Vital signs in last 24 hours: Temp:  [97.3 F (36.3 C)-99.2 F (37.3 C)] 99.2 F (37.3 C) (05/25 0759) Pulse Rate:  [65-91] 91 (05/25 0759) Resp:  [14-24] 16 (05/24 2015) BP: (106-147)/(57-75) 147/75 (05/25 0759) SpO2:  [92 %-100 %] 92 % (05/25 0759)  Intake/Output from previous day: 05/24 0701 - 05/25 0700 In: 1280 [P.O.:480; I.V.:800] Out: 2000 [Urine:1950; Blood:50] Intake/Output this shift: Total I/O In: -  Out: 400 [Urine:400]  Recent Labs    09/16/17 0410  HGB 10.7*   Recent Labs    09/16/17 0410  WBC 7.7  RBC 3.69*  HCT 33.9*  PLT 255   Recent Labs    09/16/17 0410  NA 136  K 3.5  CL 100*  CO2 26  BUN 12  CREATININE 0.78  GLUCOSE 114*  CALCIUM 8.4*   No results for input(s): LABPT, INR in the last 72 hours.  Well nourished. Alert and oriented x3. RRR, Lungs clear, BS x4. Abdomen soft and non tender. Right Calf soft and non tender. Right knee dressing C/D/I. No DVT signs. Compartment soft. No signs of infection.  Right LE grossly neurovascular intact.  Anticipated LOS equal to or greater than 2 midnights due to - Age 64 and older with one or more of the following:  - Obesity  - Expected need for hospital services (PT, OT, Nursing) required for safe  discharge  - Anticipated need for postoperative skilled nursing care or inpatient rehab  - Active co-morbidities: None OR   - Unanticipated findings during/Post Surgery: None  - Patient is a high risk of re-admission due to: None   Assessment/Plan: 1 Day Post-Op Procedure(s) (LRB): RIGHT TOTAL KNEE ARTHROPLASTY (Right) Advance diet Up with therapy D/C IV fluids Plan for discharge tomorrow Discharge home with home health  Dressing change tomorrow    Markham Jordan 09/16/2017, 9:16 AM

## 2017-09-16 NOTE — Care Management Note (Signed)
Case Management Note  Patient Details  Name: Amanda Solis MRN: [id number] Date of Birth: Dec 06, 1950  Subjective/Objective:  67 yr old female s/p right total knee arthroplasty.                 Action/Plan: Case manager spoke with patient and her husband concerning discharge plan and DME. Patient was preoperatively setup with [facility], no changes.    Expected Discharge Date:   09/17/17               Expected Discharge Plan:  Home w Home Health Services  In-House Referral:  NA  Discharge planning Services  CM Consult  Post Acute Care Choice:  Durable Medical Equipment, Home Health Choice offered to:  Patient, Spouse  DME Arranged:  3-N-1(has RW) DME Agency:  [facility]  HH Arranged:  PT HH Agency:  [facility] (formerly [facility])  Status of Service:  Completed, signed off  If discussed at [employer] of Stay Meetings, dates discussed:    Additional Comments:  Durenda Guthrie, RN 09/16/2017, 12:24 PM

## 2017-09-16 NOTE — Progress Notes (Signed)
Pt's husband helped pt back into bed. RN informed husband to please call and we would help assist her. NT also informed husband to please call when needing assistance. Will continue to reinforce this.

## 2017-09-16 NOTE — Progress Notes (Signed)
Physical Therapy Treatment Patient Details Name: Amanda Solis MRN: [id number] DOB: 11/02/1950 Today's Date: 09/16/2017    History of Present Illness 67 y.o. female s/p R TKA 09/15/17.     PT Comments    Patient has not progressed well with therapy today, extremely high pain with tears during both sessions this AM and late PM. Patients husband feels comfortable and demonstrates safety with transferring and guarding for short distance ambulation in room, however we have been unable to initiate stair training, and they have 3 stairs to enter their home. Husband agrees full flight of stairs to master bedroom is not a feasible option if patient were to go home tomorrow. PT will see tomorrow and progress as tolerated, RN aware and notifying MD of high pain, as well as tachy patients HR 130 resting, 148 after activity.     Follow Up Recommendations  Follow surgeon's recommendation for DC plan and follow-up therapies;Supervision for mobility/OOB;Home health PT     Equipment Recommendations  None recommended by PT    Recommendations for Other Services       Precautions / Restrictions Precautions Precautions: None;Knee Precaution Booklet Issued: Yes (comment) Restrictions Weight Bearing Restrictions: Yes RLE Weight Bearing: Weight bearing as tolerated    Mobility  Bed Mobility               General bed mobility comments: in chair at entry  Transfers Overall transfer level: Needs assistance Equipment used: Rolling walker (2 wheeled) Transfers: Sit to/from Stand Sit to Stand: Min assist         General transfer comment: Min A to power up, cues for hand placement and use of RW. husband present, understands cues well. pt does not remeber them  Ambulation/Gait Ambulation/Gait assistance: Min assist;Min guard Ambulation Distance (Feet): 30 Feet Assistive device: Rolling walker (2 wheeled) Gait Pattern/deviations: Step-to pattern     General Gait Details: patient in  tearful pain ambulating to bathroom and back. min guard to min A at most to stabilize, edcuated husband on proper guarding   Stairs             Wheelchair Mobility    Modified Rankin (Stroke Patients Only)       Balance Overall balance assessment: Needs assistance   Sitting balance-Leahy Scale: Good       Standing balance-Leahy Scale: Fair                              Cognition Arousal/Alertness: Awake/alert Behavior During Therapy: WFL for tasks assessed/performed Overall Cognitive Status: Within Functional Limits for tasks assessed                                        Exercises      General Comments        Pertinent Vitals/Pain Pain Assessment: 0-10 Pain Score: 8  Pain Location: R knee  Pain Descriptors / Indicators: Discomfort;Aching Pain Intervention(s): Limited activity within patient's tolerance;Monitored during session;Premedicated before session;Repositioned    Home Living                      Prior Function            PT Goals (current goals can now be found in the care plan section) Acute Rehab PT Goals Patient Stated Goal: less pain, walk up stairs at home  PT Goal Formulation: With patient Time For Goal Achievement: 09/22/17 Potential to Achieve Goals: Good    Frequency    7X/week      PT Plan Current plan remains appropriate    Co-evaluation              AM-PAC PT "6 Clicks" Daily Activity  Outcome Measure  Difficulty turning over in bed (including adjusting bedclothes, sheets and blankets)?: Unable Difficulty moving from lying on back to sitting on the side of the bed? : Unable Difficulty sitting down on and standing up from a chair with arms (e.g., wheelchair, bedside commode, etc,.)?: Unable Help needed moving to and from a bed to chair (including a wheelchair)?: A Little Help needed walking in hospital room?: A Little Help needed climbing 3-5 steps with a railing? : A Lot 6  Click Score: 11    End of Session Equipment Utilized During Treatment: Gait belt Activity Tolerance: Patient limited by pain Patient left: in bed;with call bell/phone within reach;with family/visitor present Nurse Communication: Mobility status PT Visit Diagnosis: Unsteadiness on feet (R26.81);Pain;Difficulty in walking, not elsewhere classified (R26.2) Pain - Right/Left: Right Pain - part of body: Knee     Time: 1740-1800 PT Time Calculation (min) (ACUTE ONLY): 20 min  Charges:  $Gait Training: 8-22 mins                    G Codes:       Etta Grandchild, PT, DPT Acute Rehab Services Pager: [phone]     Etta Grandchild 09/16/2017, 5:59 PM

## 2017-09-16 NOTE — Progress Notes (Signed)
Pt's HR was sitting in the 130's-140's while resting. Pain medication not helping with HR. PA on call informed and EKG ordered. EKG done and results in chart. Informed PA and RN instructed to start cardiac monitoring and change pain medications to help better control pain.

## 2017-09-17 LAB — CBC
HEMATOCRIT: 31.9 % — AB (ref 36.0–46.0)
Hemoglobin: 10.5 g/dL — ABNORMAL LOW (ref 12.0–15.0)
MCH: 29.2 pg (ref 26.0–34.0)
MCHC: 32.9 g/dL (ref 30.0–36.0)
MCV: 88.9 fL (ref 78.0–100.0)
PLATELETS: 209 10*3/uL (ref 150–400)
RBC: 3.59 MIL/uL — ABNORMAL LOW (ref 3.87–5.11)
RDW: 12 % (ref 11.5–15.5)
WBC: 8.4 10*3/uL (ref 4.0–10.5)

## 2017-09-17 NOTE — Progress Notes (Signed)
Physical Therapy Treatment Patient Details Name: Amanda Solis MRN: [id number] DOB: 06-22-1950 Today's Date: 09/17/2017    History of Present Illness 67 y.o. female s/p R TKA 09/15/17.     PT Comments    Pt is progressing well this PM with gait and mobility.  We were able to walk a good distance down the hallway with less cues than the beginning of the walk.  Pt's husband has decided he will take her home and has been provided a list of private duty aids to help him with her care.  He has been assisting her around the room when therapy and RN staff are not there and managing her well.     Follow Up Recommendations  Home health PT;Follow surgeon's recommendation for DC plan and follow-up therapies;Supervision/Assistance - 24 hour     Equipment Recommendations  Hospital bed    Recommendations for Other Services   NA     Precautions / Restrictions Precautions Precautions: Fall;Knee Restrictions RLE Weight Bearing: Weight bearing as tolerated    Mobility  Bed Mobility Overal bed mobility: Needs Assistance Bed Mobility: Supine to Sit;Sit to Supine     Supine to sit: Min assist;HOB elevated Sit to supine: Min assist;HOB elevated   General bed mobility comments: Min assist to help progress her right leg into and out of the bed.   Transfers Overall transfer level: Needs assistance Equipment used: Rolling walker (2 wheeled) Transfers: Sit to/from Stand Sit to Stand: Min guard         General transfer comment: Min guard assist for safety, verbal cues for safe hand placement.   Ambulation/Gait Ambulation/Gait assistance: Min assist Ambulation Distance (Feet): 75 Feet Assistive device: Rolling walker (2 wheeled) Gait Pattern/deviations: Step-through pattern;Antalgic     General Gait Details: Pt with moderately antalgic gait pattern that improved with increased gait distance.  Cues intially for LE sequence, but then pt got into a pattern and was able to walk without  cues and without therapist pushing the RW forward.           Balance Overall balance assessment: Needs assistance Sitting-balance support: Feet supported Sitting balance-Leahy Scale: Good     Standing balance support: Bilateral upper extremity supported Standing balance-Leahy Scale: Poor                              Cognition Arousal/Alertness: Awake/alert Behavior During Therapy: WFL for tasks assessed/performed Overall Cognitive Status: Impaired/Different from baseline Area of Impairment: Memory                     Memory: Decreased recall of precautions;Decreased short-term memory                       Pertinent Vitals/Pain Pain Assessment: Faces Faces Pain Scale: Hurts little more Pain Location: R knee  Pain Descriptors / Indicators: Grimacing;Guarding Pain Intervention(s): Limited activity within patient's tolerance;Monitored during session;Repositioned;Ice applied           PT Goals (current goals can now be found in the care plan section) Acute Rehab PT Goals Patient Stated Goal: decrease pain Progress towards PT goals: Progressing toward goals    Frequency    7X/week      PT Plan Discharge plan needs to be updated       AM-PAC PT "6 Clicks" Daily Activity  Outcome Measure  Difficulty turning over in bed (including adjusting bedclothes, sheets and blankets)?:  Unable Difficulty moving from lying on back to sitting on the side of the bed? : Unable Difficulty sitting down on and standing up from a chair with arms (e.g., wheelchair, bedside commode, etc,.)?: Unable Help needed moving to and from a bed to chair (including a wheelchair)?: A Little Help needed walking in hospital room?: A Little Help needed climbing 3-5 steps with a railing? : A Little 6 Click Score: 12    End of Session Equipment Utilized During Treatment: Gait belt Activity Tolerance: Patient limited by pain Patient left: in bed;with call bell/phone  within reach;in CPM   PT Visit Diagnosis: Muscle weakness (generalized) (M62.81);Difficulty in walking, not elsewhere classified (R26.2);Pain Pain - Right/Left: Right Pain - part of body: Knee     Time: 9604-5409 PT Time Calculation (min) (ACUTE ONLY): 16 min  Charges:  $Gait Training: 8-22 mins          Amanda Solis, PT, DPT [phone]            09/17/2017, 3:41 PM

## 2017-09-17 NOTE — Care Management (Signed)
Pt s/p RT TKA which requires leg to be positioned in ways not feasible with a normal bed.  Pt requires frequent and immediate changes in body position to alleviate pain which cannot be achieved with a normal bed.    Quintella Baton, RN, BSN  Trauma/Neuro ICU Case Manager [phone]

## 2017-09-17 NOTE — Progress Notes (Signed)
Husband has been with the patient during the night. He realizes how much work is involved in caring for his wife. He is asking to speak with the case manager/social worker to place her in a rehab facility or to determine if his insurance will cover for a sitter/aide at home at night.  The patient is managing her ADL's well, however she has to be told how to do everything. Every simple move requires giving her repeated instructions. She does require at least one assist when up with the walker.

## 2017-09-17 NOTE — Progress Notes (Signed)
Physical Therapy Treatment Patient Details Name: Amanda Solis MRN: [id number] DOB: Jul 22, 1950 Today's Date: 09/17/2017    History of Present Illness 67 y.o. female s/p R TKA 09/15/17.     PT Comments    Pt is not progressing as anticipated, limited to in room gait (short distance) this AM due to lightheadedness, despite stable BP.  Husband is now wanting to pursue rehab post acute and PT is in agreement with this plan.     Follow Up Recommendations  SNF;Follow surgeon's recommendation for DC plan and follow-up therapies     Equipment Recommendations  None recommended by PT    Recommendations for Other Services   NA     Precautions / Restrictions Precautions Precautions: Fall;Knee Restrictions Weight Bearing Restrictions: Yes RLE Weight Bearing: Weight bearing as tolerated    Mobility  Bed Mobility Overal bed mobility: Needs Assistance Bed Mobility: Supine to Sit;Sit to Supine     Supine to sit: Min assist;HOB elevated Sit to supine: Min assist;HOB elevated   General bed mobility comments: Min assist to help progress right leg to EOB and lift it back into bed to get to supine.   Transfers Overall transfer level: Needs assistance Equipment used: Rolling walker (2 wheeled) Transfers: Sit to/from Stand Sit to Stand: Min assist         General transfer comment: Min assist to assist trunk to power up to stand and max repeated verbal cues for safe hand placement as pt has poor STM.   Ambulation/Gait Ambulation/Gait assistance: Min assist Ambulation Distance (Feet): 15 Feet(x2) Assistive device: Rolling walker (2 wheeled) Gait Pattern/deviations: Step-to pattern;Antalgic Gait velocity: decreased   General Gait Details: Pt with moderately antalgic gait pattern, max verbal cues again for correct LE sequencing.  Min assist to steady trunk and pt reporting lighthededness, limiting gait distance.  BP taken and was stable (see vitals flow sheet).             Balance Overall balance assessment: Needs assistance Sitting-balance support: Feet supported;Bilateral upper extremity supported Sitting balance-Leahy Scale: Fair     Standing balance support: Bilateral upper extremity supported Standing balance-Leahy Scale: Poor                              Cognition Arousal/Alertness: Awake/alert Behavior During Therapy: WFL for tasks assessed/performed Overall Cognitive Status: History of cognitive impairments - at baseline                                 General Comments: Very reliant on husband.             Pertinent Vitals/Pain Pain Assessment: Faces Pain Score: 3 (at rest) Faces Pain Scale: Hurts even more Pain Location: R knee  Pain Descriptors / Indicators: Discomfort;Aching Pain Intervention(s): Limited activity within patient's tolerance;Monitored during session;Premedicated before session;Repositioned           PT Goals (current goals can now be found in the care plan section) Acute Rehab PT Goals Patient Stated Goal: decrease pain Progress towards PT goals: Progressing toward goals    Frequency    7X/week      PT Plan Discharge plan needs to be updated       AM-PAC PT "6 Clicks" Daily Activity  Outcome Measure  Difficulty turning over in bed (including adjusting bedclothes, sheets and blankets)?: Unable Difficulty moving from lying on back to sitting on  the side of the bed? : Unable Difficulty sitting down on and standing up from a chair with arms (e.g., wheelchair, bedside commode, etc,.)?: Unable Help needed moving to and from a bed to chair (including a wheelchair)?: A Little Help needed walking in hospital room?: A Little Help needed climbing 3-5 steps with a railing? : A Little 6 Click Score: 12    End of Session   Activity Tolerance: Patient limited by pain(limited by lightheadedness)     PT Visit Diagnosis: Muscle weakness (generalized) (M62.81);Difficulty in walking, not  elsewhere classified (R26.2);Pain Pain - Right/Left: Right Pain - part of body: Knee     Time: 0732-0758 PT Time Calculation (min) (ACUTE ONLY): 26 min  Charges:  $Therapeutic Activity: 23-37 mins          Branndon Tuite B. Temara Lanum, PT, DPT [phone]            09/17/2017, 10:30 AM

## 2017-09-17 NOTE — Care Management Note (Signed)
Case Management Note Original note by: Durenda Guthrie, RN 09/16/2017, 12:24 PM    Patient Details  Name: Amanda Solis MRN: [id number] Date of Birth: 11/17/1950  Subjective/Objective:  67 yr old female s/p right total knee arthroplasty.                 Action/Plan: Case manager spoke with patient and her husband concerning discharge plan and DME. Patient was preoperatively setup with [facility], no changes.    Expected Discharge Date:   09/17/17               Expected Discharge Plan:  Home w Home Health Services  In-House Referral:  NA  Discharge planning Services  CM Consult  Post Acute Care Choice:  Durable Medical Equipment, Home Health Choice offered to:  Patient, Spouse  DME Arranged:  3-N-1, Hospital bed(has RW) DME Agency:  [facility]  HH Arranged:  PT HH Agency:  [facility] ([facility])  Status of Service:  Completed, signed off  If discussed at [employer] of Stay Meetings, dates discussed:    Additional Comments:  09/17/17 J. Telisha Zawadzki, RN, BSN Pt/family requesting hospital bed, as well as 3 in 1.  Orders in for DME; HHPT has been arranged with [facility].  Family interested in possible private duty aides or sitters at [employer]; provided hospital-prepared sitter/private duty agency list for daughter.    Glennon Mac, RN 09/17/2017, 2:47 PM

## 2017-09-17 NOTE — Progress Notes (Signed)
Orthopedic Tech Progress Note Patient Details:  Amanda Solis Amanda Solis Feb 13, 1951 [id number]  Patient ID: Amanda Solis, female   DOB: 1950-10-09, 66 y.o.   MRN: [id number] Pt will call when ready for cpm.  Trinna Post 09/17/2017, 6:04 PM

## 2017-09-17 NOTE — Progress Notes (Signed)
Orthopedics Progress Note  Subjective: Patient reports lightheadedness with getting up.  This does clear with time. Her BP has been stable per nursing staff.  Objective:  Vitals:   09/17/17 0600 09/17/17 0753  BP: 119/67 120/71  Pulse: 91 93  Resp:    Temp: 97.7 F (36.5 C)   SpO2: 97% 98%    General: Awake and alert  Musculoskeletal: bandage changed, incision looks great, minimal swelling and no erythema, no pain with Calf pumps Neurovascularly intact  Lab Results  Component Value Date   WBC 8.4 09/17/2017   HGB 10.5 (L) 09/17/2017   HCT 31.9 (L) 09/17/2017   MCV 88.9 09/17/2017   PLT 209 09/17/2017       Component Value Date/Time   NA 136 09/16/2017 0410   NA 140 10/20/2016 0923   K 3.5 09/16/2017 0410   CL 100 (L) 09/16/2017 0410   CL 103 09/16/2015   CO2 26 09/16/2017 0410   GLUCOSE 114 (H) 09/16/2017 0410   BUN 12 09/16/2017 0410   BUN 19 10/20/2016 0923   CREATININE 0.78 09/16/2017 0410   CALCIUM 8.4 (L) 09/16/2017 0410   CALCIUM 9.6 09/16/2015   GFRNONAA >60 09/16/2017 0410   GFRNONAA 65 09/16/2015   GFRAA >60 09/16/2017 0410    No results found for: INR, PROTIME  Assessment/Plan: POD #2 s/p Procedure(s): RIGHT TOTAL KNEE ARTHROPLASTY Patient not ready for discharge home.  We are planning on going home, not to rehab upon discharge. Plan to continue therapy here as an inpatient with likely discharge home tomorrow after her therapy. Will be needing in home therapy and hospital bed. Will begin outpatient therapy as soon as possible. Continue DVT prophylaxis  Almedia Balls. Ranell Patrick, MD 09/17/2017 8:22 AM

## 2017-09-18 ENCOUNTER — Encounter (HOSPITAL_COMMUNITY): Payer: Self-pay | Admitting: Orthopedic Surgery

## 2017-09-18 LAB — CBC
HEMATOCRIT: 29.3 % — AB (ref 36.0–46.0)
HEMOGLOBIN: 9.5 g/dL — AB (ref 12.0–15.0)
MCH: 29.2 pg (ref 26.0–34.0)
MCHC: 32.4 g/dL (ref 30.0–36.0)
MCV: 90.2 fL (ref 78.0–100.0)
Platelets: 195 10*3/uL (ref 150–400)
RBC: 3.25 MIL/uL — AB (ref 3.87–5.11)
RDW: 12.1 % (ref 11.5–15.5)
WBC: 8.4 10*3/uL (ref 4.0–10.5)

## 2017-09-18 NOTE — Progress Notes (Signed)
Pt given suppository and warm prune juice and instructed to ambulate to assist with BM prior to dc. Spouse assisting pt with ambulation in hallway.

## 2017-09-18 NOTE — Discharge Summary (Signed)
Orthopedic Discharge Summary        Physician Discharge Summary  Patient ID: Amanda Solis MRN: [id number] DOB/AGE: 10/17/1950 67 y.o.  Admit date: 09/15/2017 Discharge date: 09/18/2017   Procedures:  Procedure(s) (LRB): RIGHT TOTAL KNEE ARTHROPLASTY (Right)  Attending Physician:  Dr. Malon Kindle  Admission Diagnoses:   Right knee primary OA, end stage  Discharge Diagnoses:  Right knee primary OA, end stage   Past Medical History:  Diagnosis Date  . Anxiety   . Arthritis   . Diverticulitis   . Headache   . Hyperlipidemia   . Hypothyroidism   . Thyroid disease     PCP: [name], PA   Discharged Condition: good  Hospital Course:  Patient underwent the above stated procedure on 09/15/2017. Patient tolerated the procedure well and brought to the recovery room in good condition and subsequently to the floor. Patient had an uncomplicated hospital course and was stable for discharge. Home health arrangements made   Disposition: Discharge disposition: 01-Home or Self Care      with follow up in 2 weeks   Follow-up Information    Beverely Low, MD. Call in 2 weeks.   Specialty:  Orthopedic Surgery Why:  [phone] Contact information: [address] [place] [address] [phone]        [facility] Follow [facility].   Specialty:  Home Health Services Why:  A representative from [facility] will contact you to arrange start date and time for your therapy. Contact information: [address] [place] [address] [place] [address] [phone]           Discharge Instructions    Call MD / Call 911   Complete by:  As directed    If you experience chest pain or shortness of breath, CALL 911 and be transported to the hospital emergency room.  If you develope a fever above 101 F, pus (white drainage) or increased drainage or redness at the wound, or calf pain, call your surgeon's office.   Constipation Prevention   Complete  by:  As directed    Drink plenty of fluids.  Prune juice may be helpful.  You may use a stool softener, such as Colace (over the counter) 100 mg twice a day.  Use MiraLax (over the counter) for constipation as needed.   Diet - low sodium heart healthy   Complete by:  As directed    Driving restrictions   Complete by:  As directed    No driving for 3 weeks   Increase activity slowly as tolerated   Complete by:  As directed       Allergies as of 09/18/2017   No Known Allergies     Medication List    STOP taking these medications   chlorpheniramine-HYDROcodone 10-8 MG/5ML Suer Commonly known as:  TUSSIONEX PENNKINETIC ER   zolpidem 6.25 MG CR tablet Commonly known as:  AMBIEN CR     TAKE these medications   aspirin 81 MG chewable tablet Commonly known as:  ASPIRIN CHILDRENS Chew 1 tablet (81 mg total) by mouth 2 (two) times daily.   cholecalciferol 1000 units tablet Commonly known as:  VITAMIN D Take 1,000 Units by mouth daily.   escitalopram 10 MG tablet Commonly known as:  LEXAPRO Take 1 tablet (10 mg total) by mouth daily.   levothyroxine 150 MCG tablet Commonly known as:  SYNTHROID, LEVOTHROID Take 150 mcg by mouth daily before breakfast. What changed:  Another medication  with the same name was removed. Continue taking this medication, and follow the directions you see here.   methocarbamol 500 MG tablet Commonly known as:  ROBAXIN Take 1 tablet (500 mg total) by mouth every 6 (six) hours as needed.   multivitamin with minerals Tabs tablet Take 1 tablet by mouth daily with lunch.   naproxen sodium 220 MG tablet Commonly known as:  ALEVE Take 440 mg by mouth daily as needed (pain).   oxyCODONE-acetaminophen 5-325 MG tablet Commonly known as:  PERCOCET Take 1-2 tablets by mouth every 4 (four) hours as needed for severe pain.   simvastatin 20 MG tablet Commonly known as:  ZOCOR Take 1 tablet (20 mg total) by mouth daily at 6 PM.            Durable  Medical Equipment  (From admission, onward)        Start     Ordered   09/17/17 1143  For home use only DME Bedside commode  Once    Question:  Patient needs a bedside commode to treat with the following condition  Answer:  S/P TKR (total knee replacement), right   09/17/17 1143   09/17/17 0828  For home use only DME Hospital bed  Once    Question Answer Comment  Patient has (list medical condition): right total knee replacement   The above medical condition requires: Patient requires the ability to reposition frequently   Bed type Semi-electric   T[place]ze [place] Yes      09/17/17 0827        Signed: Verlee Rossetti 09/18/2017, 8:58 AM  [facility] is now [employer] [address], [place], [place] [address] Phone: [phone] Facebook  [employer]

## 2017-09-18 NOTE — Progress Notes (Signed)
Pt given prescriptions and discharge instructions. Instructions gone over with her and husband. All questions answered to satisfaction. Ted hose put on right knee. Showed Husband how to put on knee immobilizer and he demonstrated understanding. All belongings gathered to be sent home.

## 2017-09-18 NOTE — Progress Notes (Signed)
Orthopedics Progress Note  Subjective: Patient is still having a fair amount of pain. She is ambulating to the bathroom.  Objective:  Vitals:   09/17/17 2016 09/18/17 0655  BP: 129/71 112/62  Pulse: 95 94  Resp:    Temp: 98.9 F (37.2 C) 98.9 F (37.2 C)  SpO2: 98% 98%    General: Awake and alert  Musculoskeletal: Right knee incision covered with Aquacel, minimal swelling in the leg. No pain with AROM of the ankle Neurovascularly intact  Lab Results  Component Value Date   WBC 8.4 09/18/2017   HGB 9.5 (L) 09/18/2017   HCT 29.3 (L) 09/18/2017   MCV 90.2 09/18/2017   PLT 195 09/18/2017       Component Value Date/Time   NA 136 09/16/2017 0410   NA 140 10/20/2016 0923   K 3.5 09/16/2017 0410   CL 100 (L) 09/16/2017 0410   CL 103 09/16/2015   CO2 26 09/16/2017 0410   GLUCOSE 114 (H) 09/16/2017 0410   BUN 12 09/16/2017 0410   BUN 19 10/20/2016 0923   CREATININE 0.78 09/16/2017 0410   CALCIUM 8.4 (L) 09/16/2017 0410   CALCIUM 9.6 09/16/2015   GFRNONAA >60 09/16/2017 0410   GFRNONAA 65 09/16/2015   GFRAA >60 09/16/2017 0410    No results found for: INR, PROTIME  Assessment/Plan: POD #3 s/p Procedure(s): RIGHT TOTAL KNEE ARTHROPLASTY D/C home today once home health arrangements made. Follow up in the office in two weeks with Dr Ranell Patrick, [phone]  Almedia Balls. Ranell Patrick, MD 09/18/2017 8:54 AM

## 2017-09-18 NOTE — Care Management Important Message (Signed)
Important Message  Patient Details  Name: Amanda Solis MRN: [id number] Date of Birth: 1950/06/23   Medicare Important Message Given:  Yes    [name] 09/18/2017, 12:48 PM

## 2017-09-18 NOTE — Progress Notes (Signed)
Physical Therapy Treatment Patient Details Name: Amanda Solis MRN: [id number] DOB: [birth year] Today's Date: 09/18/2017    History of Present Illness 67 y.o. female s/p R TKA 09/15/17.     PT Comments    Pt was able to practice stairs with PT simulating home entry with her husband there assisting.  She was also able to walk all the way back to her room from the therapy gym which is the furthest she has walked yet.  We continued to review HEP.  She is due to d/c home this PM.  PT to follow acutely until d/c confirmed.      Follow Up Recommendations  Home health PT;Follow surgeon's recommendation for DC plan and follow-up therapies;Supervision/Assistance - 24 hour     Equipment Recommendations  Hospital bed    Recommendations for Other Services   NA     Precautions / Restrictions Precautions Precautions: Fall;Knee Restrictions RLE Weight Bearing: Weight bearing as tolerated    Mobility  Bed Mobility               General bed mobility comments: Pt was OOB in the recliner chair.   Transfers Overall transfer level: Needs assistance Equipment used: Rolling walker (2 wheeled) Transfers: Sit to/from Stand Sit to Stand: Min guard         General transfer comment: Min guard assist for transitions, verbal cues for safe hand placement.   Ambulation/Gait Ambulation/Gait assistance: Min guard Ambulation Distance (Feet): 120 Feet Assistive device: Rolling walker (2 wheeled) Gait Pattern/deviations: Step-through pattern;Antalgic     General Gait Details: More verbal cues initially and then able to taper off as pt got into a good walking pattern, cues for upright posture and heel to toe gait pattern.    Stairs Stairs: Yes Stairs assistance: Min assist;+2 safety/equipment Stair Management: No rails;Step to pattern;Backwards;With walker Number of Stairs: 3 General stair comments: Practiced stairs backwards with RW simulating home entry.  Two person assist for  safety and per pt he will have someone else with him helping her in the house, so husband took the front position and PT was behind pt during stair practice.         Balance Overall balance assessment: Needs assistance Sitting-balance support: Feet supported;No upper extremity supported Sitting balance-Leahy Scale: Good     Standing balance support: Bilateral upper extremity supported;No upper extremity supported;Single extremity supported Standing balance-Leahy Scale: Fair                              Cognition Arousal/Alertness: Awake/alert Behavior During Therapy: WFL for tasks assessed/performed Overall Cognitive Status: Impaired/Different from baseline Area of Impairment: Memory                               General Comments: Very reliant on husband.      Exercises Total Joint Exercises Long Arc Quad: Amanda Solis;Right;10 reps Knee Flexion: AROM;AAROM;Right;20 reps;Seated Goniometric ROM: 12-85        Pertinent Vitals/Pain Pain Assessment: Faces Faces Pain Scale: Hurts even more Pain Location: R knee  Pain Descriptors / Indicators: Grimacing;Guarding Pain Intervention(s): Monitored during session;Limited activity within patient's tolerance;Repositioned;Ice applied           PT Goals (current goals can now be found in the care plan section) Acute Rehab PT Goals Patient Stated Goal: decrease pain Progress towards PT goals: Progressing toward goals    Frequency  7X/week      PT Plan Current plan remains appropriate       AM-PAC PT "6 Clicks" Daily Activity  Outcome Measure  Difficulty turning over in bed (including adjusting bedclothes, sheets and blankets)?: Unable Difficulty moving from lying on back to sitting on the side of the bed? : Unable Difficulty sitting down on and standing up from a chair with arms (e.g., wheelchair, bedside commode, etc,.)?: Unable Help needed moving to and from a bed to chair (including a  wheelchair)?: A Little Help needed walking in hospital room?: A Little Help needed climbing 3-5 steps with a railing? : A Little 6 Click Score: 12    End of Session Equipment Utilized During Treatment: Gait belt Activity Tolerance: Patient limited by pain Patient left: in chair;with call bell/phone within reach;with family/visitor present Nurse Communication: Mobility status PT Visit Diagnosis: Muscle weakness (generalized) (M62.81);Difficulty in walking, not elsewhere classified (R26.2);Pain Pain - Right/Left: Right Pain - part of body: Knee     Time: 7829-5621 PT Time Calculation (min) (ACUTE ONLY): 32 min  Charges:  $Gait Training: 8-22 mins $Therapeutic Exercise: 8-22 mins          Amanda Solis B. Amanda Solis, PT, DPT [phone]            09/18/2017, 2:00 PM

## 2017-09-19 DIAGNOSIS — Z96651 Presence of right artificial knee joint: Secondary | ICD-10-CM | POA: Diagnosis not present

## 2017-09-19 DIAGNOSIS — M1711 Unilateral primary osteoarthritis, right knee: Secondary | ICD-10-CM | POA: Diagnosis not present

## 2017-09-20 DIAGNOSIS — K5732 Diverticulitis of large intestine without perforation or abscess without bleeding: Secondary | ICD-10-CM | POA: Diagnosis not present

## 2017-09-20 DIAGNOSIS — G4700 Insomnia, unspecified: Secondary | ICD-10-CM | POA: Diagnosis not present

## 2017-09-20 DIAGNOSIS — E785 Hyperlipidemia, unspecified: Secondary | ICD-10-CM | POA: Diagnosis not present

## 2017-09-20 DIAGNOSIS — Z96651 Presence of right artificial knee joint: Secondary | ICD-10-CM | POA: Diagnosis not present

## 2017-09-20 DIAGNOSIS — Z471 Aftercare following joint replacement surgery: Secondary | ICD-10-CM | POA: Diagnosis not present

## 2017-09-20 DIAGNOSIS — Z9181 History of falling: Secondary | ICD-10-CM | POA: Diagnosis not present

## 2017-09-20 DIAGNOSIS — E039 Hypothyroidism, unspecified: Secondary | ICD-10-CM | POA: Diagnosis not present

## 2017-09-20 DIAGNOSIS — Z7982 Long term (current) use of aspirin: Secondary | ICD-10-CM | POA: Diagnosis not present

## 2017-09-20 DIAGNOSIS — E559 Vitamin D deficiency, unspecified: Secondary | ICD-10-CM | POA: Diagnosis not present

## 2017-09-24 ENCOUNTER — Other Ambulatory Visit: Payer: Self-pay | Admitting: Family Medicine

## 2017-09-25 DIAGNOSIS — Z96651 Presence of right artificial knee joint: Secondary | ICD-10-CM | POA: Diagnosis not present

## 2017-09-25 DIAGNOSIS — E039 Hypothyroidism, unspecified: Secondary | ICD-10-CM | POA: Diagnosis not present

## 2017-09-25 DIAGNOSIS — Z7982 Long term (current) use of aspirin: Secondary | ICD-10-CM | POA: Diagnosis not present

## 2017-09-25 DIAGNOSIS — K5732 Diverticulitis of large intestine without perforation or abscess without bleeding: Secondary | ICD-10-CM | POA: Diagnosis not present

## 2017-09-25 DIAGNOSIS — G4700 Insomnia, unspecified: Secondary | ICD-10-CM | POA: Diagnosis not present

## 2017-09-25 DIAGNOSIS — Z9181 History of falling: Secondary | ICD-10-CM | POA: Diagnosis not present

## 2017-09-25 DIAGNOSIS — E785 Hyperlipidemia, unspecified: Secondary | ICD-10-CM | POA: Diagnosis not present

## 2017-09-25 DIAGNOSIS — Z471 Aftercare following joint replacement surgery: Secondary | ICD-10-CM | POA: Diagnosis not present

## 2017-09-25 DIAGNOSIS — E559 Vitamin D deficiency, unspecified: Secondary | ICD-10-CM | POA: Diagnosis not present

## 2017-09-28 ENCOUNTER — Other Ambulatory Visit: Payer: Self-pay | Admitting: Family Medicine

## 2017-09-28 DIAGNOSIS — Z96651 Presence of right artificial knee joint: Secondary | ICD-10-CM | POA: Diagnosis not present

## 2017-09-28 DIAGNOSIS — Z471 Aftercare following joint replacement surgery: Secondary | ICD-10-CM | POA: Diagnosis not present

## 2017-09-28 DIAGNOSIS — M1711 Unilateral primary osteoarthritis, right knee: Secondary | ICD-10-CM | POA: Diagnosis not present

## 2017-10-03 DIAGNOSIS — M25561 Pain in right knee: Secondary | ICD-10-CM | POA: Diagnosis not present

## 2017-10-05 DIAGNOSIS — M25561 Pain in right knee: Secondary | ICD-10-CM | POA: Diagnosis not present

## 2017-10-09 DIAGNOSIS — E039 Hypothyroidism, unspecified: Secondary | ICD-10-CM | POA: Diagnosis not present

## 2017-10-09 DIAGNOSIS — E7800 Pure hypercholesterolemia, unspecified: Secondary | ICD-10-CM | POA: Diagnosis not present

## 2017-10-10 DIAGNOSIS — M25561 Pain in right knee: Secondary | ICD-10-CM | POA: Diagnosis not present

## 2017-10-12 DIAGNOSIS — M25561 Pain in right knee: Secondary | ICD-10-CM | POA: Diagnosis not present

## 2017-10-17 DIAGNOSIS — M25561 Pain in right knee: Secondary | ICD-10-CM | POA: Diagnosis not present

## 2017-10-19 DIAGNOSIS — M25561 Pain in right knee: Secondary | ICD-10-CM | POA: Diagnosis not present

## 2017-10-20 ENCOUNTER — Other Ambulatory Visit: Payer: Self-pay | Admitting: Family Medicine

## 2017-10-24 DIAGNOSIS — M25561 Pain in right knee: Secondary | ICD-10-CM | POA: Diagnosis not present

## 2017-10-25 ENCOUNTER — Telehealth: Payer: Self-pay

## 2017-10-25 NOTE — Telephone Encounter (Signed)
Patient is no longer coming to us for primary care, she has went back to her pervious PCP. MPulliam, CMA/RT(R)

## 2017-10-27 DIAGNOSIS — M25561 Pain in right knee: Secondary | ICD-10-CM | POA: Diagnosis not present

## 2017-10-31 DIAGNOSIS — M25561 Pain in right knee: Secondary | ICD-10-CM | POA: Diagnosis not present

## 2017-11-03 DIAGNOSIS — M25561 Pain in right knee: Secondary | ICD-10-CM | POA: Diagnosis not present

## 2017-11-19 ENCOUNTER — Other Ambulatory Visit: Payer: Self-pay | Admitting: Family Medicine

## 2017-12-03 ENCOUNTER — Other Ambulatory Visit: Payer: Self-pay | Admitting: Family Medicine

## 2017-12-11 DIAGNOSIS — E039 Hypothyroidism, unspecified: Secondary | ICD-10-CM | POA: Diagnosis not present

## 2017-12-12 DIAGNOSIS — Z6823 Body mass index (BMI) 23.0-23.9, adult: Secondary | ICD-10-CM | POA: Diagnosis not present

## 2017-12-12 DIAGNOSIS — Z124 Encounter for screening for malignant neoplasm of cervix: Secondary | ICD-10-CM | POA: Diagnosis not present

## 2017-12-12 DIAGNOSIS — Z1231 Encounter for screening mammogram for malignant neoplasm of breast: Secondary | ICD-10-CM | POA: Diagnosis not present

## 2017-12-12 DIAGNOSIS — M8588 Other specified disorders of bone density and structure, other site: Secondary | ICD-10-CM | POA: Diagnosis not present

## 2017-12-12 DIAGNOSIS — N958 Other specified menopausal and perimenopausal disorders: Secondary | ICD-10-CM | POA: Diagnosis not present

## 2017-12-13 DIAGNOSIS — L821 Other seborrheic keratosis: Secondary | ICD-10-CM | POA: Diagnosis not present

## 2018-01-19 DIAGNOSIS — E039 Hypothyroidism, unspecified: Secondary | ICD-10-CM | POA: Diagnosis not present

## 2018-01-19 DIAGNOSIS — G4700 Insomnia, unspecified: Secondary | ICD-10-CM | POA: Diagnosis not present

## 2018-01-19 DIAGNOSIS — F419 Anxiety disorder, unspecified: Secondary | ICD-10-CM | POA: Diagnosis not present

## 2018-01-19 DIAGNOSIS — E559 Vitamin D deficiency, unspecified: Secondary | ICD-10-CM | POA: Diagnosis not present

## 2018-01-19 DIAGNOSIS — Z23 Encounter for immunization: Secondary | ICD-10-CM | POA: Diagnosis not present

## 2018-01-19 DIAGNOSIS — E7800 Pure hypercholesterolemia, unspecified: Secondary | ICD-10-CM | POA: Diagnosis not present

## 2018-02-02 DIAGNOSIS — E039 Hypothyroidism, unspecified: Secondary | ICD-10-CM | POA: Diagnosis not present

## 2018-03-05 DIAGNOSIS — J208 Acute bronchitis due to other specified organisms: Secondary | ICD-10-CM | POA: Diagnosis not present

## 2018-03-15 DIAGNOSIS — Z471 Aftercare following joint replacement surgery: Secondary | ICD-10-CM | POA: Diagnosis not present

## 2018-03-15 DIAGNOSIS — E039 Hypothyroidism, unspecified: Secondary | ICD-10-CM | POA: Diagnosis not present

## 2018-03-15 DIAGNOSIS — Z96651 Presence of right artificial knee joint: Secondary | ICD-10-CM | POA: Diagnosis not present

## 2018-03-26 DIAGNOSIS — J209 Acute bronchitis, unspecified: Secondary | ICD-10-CM | POA: Diagnosis not present

## 2018-04-13 ENCOUNTER — Ambulatory Visit
Admission: RE | Admit: 2018-04-13 | Discharge: 2018-04-13 | Disposition: A | Discharge status: Home / Self Care | Payer: PPO | Source: Ambulatory Visit | Attending: Family Medicine | Admitting: Family Medicine

## 2018-04-13 ENCOUNTER — Other Ambulatory Visit: Payer: Self-pay | Admitting: Family Medicine

## 2018-04-13 DIAGNOSIS — R05 Cough: Secondary | ICD-10-CM

## 2018-04-13 DIAGNOSIS — R059 Cough, unspecified: Secondary | ICD-10-CM

## 2018-05-21 DIAGNOSIS — E039 Hypothyroidism, unspecified: Secondary | ICD-10-CM | POA: Diagnosis not present

## 2018-06-19 DIAGNOSIS — R05 Cough: Secondary | ICD-10-CM | POA: Diagnosis not present

## 2018-06-19 DIAGNOSIS — Z6824 Body mass index (BMI) 24.0-24.9, adult: Secondary | ICD-10-CM | POA: Diagnosis not present

## 2018-08-09 DIAGNOSIS — E039 Hypothyroidism, unspecified: Secondary | ICD-10-CM | POA: Diagnosis not present

## 2018-08-14 DIAGNOSIS — E039 Hypothyroidism, unspecified: Secondary | ICD-10-CM | POA: Diagnosis not present

## 2018-08-14 DIAGNOSIS — K219 Gastro-esophageal reflux disease without esophagitis: Secondary | ICD-10-CM | POA: Diagnosis not present

## 2018-08-31 DIAGNOSIS — E559 Vitamin D deficiency, unspecified: Secondary | ICD-10-CM | POA: Diagnosis not present

## 2018-08-31 DIAGNOSIS — F419 Anxiety disorder, unspecified: Secondary | ICD-10-CM | POA: Diagnosis not present

## 2018-08-31 DIAGNOSIS — E2839 Other primary ovarian failure: Secondary | ICD-10-CM | POA: Diagnosis not present

## 2018-08-31 DIAGNOSIS — Z1389 Encounter for screening for other disorder: Secondary | ICD-10-CM | POA: Diagnosis not present

## 2018-08-31 DIAGNOSIS — Z87898 Personal history of other specified conditions: Secondary | ICD-10-CM | POA: Diagnosis not present

## 2018-08-31 DIAGNOSIS — E7800 Pure hypercholesterolemia, unspecified: Secondary | ICD-10-CM | POA: Diagnosis not present

## 2018-08-31 DIAGNOSIS — E039 Hypothyroidism, unspecified: Secondary | ICD-10-CM | POA: Diagnosis not present

## 2018-08-31 DIAGNOSIS — Z0000 Encounter for general adult medical examination without abnormal findings: Secondary | ICD-10-CM | POA: Diagnosis not present

## 2018-09-25 DIAGNOSIS — E039 Hypothyroidism, unspecified: Secondary | ICD-10-CM | POA: Diagnosis not present

## 2018-11-08 DIAGNOSIS — H5203 Hypermetropia, bilateral: Secondary | ICD-10-CM | POA: Diagnosis not present

## 2018-11-08 DIAGNOSIS — H2513 Age-related nuclear cataract, bilateral: Secondary | ICD-10-CM | POA: Diagnosis not present

## 2018-11-08 DIAGNOSIS — H524 Presbyopia: Secondary | ICD-10-CM | POA: Diagnosis not present

## 2018-11-08 DIAGNOSIS — H52222 Regular astigmatism, left eye: Secondary | ICD-10-CM | POA: Diagnosis not present

## 2018-11-23 DIAGNOSIS — E349 Endocrine disorder, unspecified: Secondary | ICD-10-CM | POA: Diagnosis not present

## 2018-11-28 DIAGNOSIS — M545 Low back pain: Secondary | ICD-10-CM | POA: Diagnosis not present

## 2018-11-28 DIAGNOSIS — Z471 Aftercare following joint replacement surgery: Secondary | ICD-10-CM | POA: Diagnosis not present

## 2018-11-28 DIAGNOSIS — Z96651 Presence of right artificial knee joint: Secondary | ICD-10-CM | POA: Diagnosis not present

## 2018-12-11 DIAGNOSIS — M5416 Radiculopathy, lumbar region: Secondary | ICD-10-CM | POA: Diagnosis not present

## 2018-12-17 DIAGNOSIS — Z1231 Encounter for screening mammogram for malignant neoplasm of breast: Secondary | ICD-10-CM | POA: Diagnosis not present

## 2018-12-17 DIAGNOSIS — Z01419 Encounter for gynecological examination (general) (routine) without abnormal findings: Secondary | ICD-10-CM | POA: Diagnosis not present

## 2018-12-17 DIAGNOSIS — Z6824 Body mass index (BMI) 24.0-24.9, adult: Secondary | ICD-10-CM | POA: Diagnosis not present

## 2018-12-18 DIAGNOSIS — M5416 Radiculopathy, lumbar region: Secondary | ICD-10-CM | POA: Diagnosis not present

## 2018-12-21 DIAGNOSIS — M5416 Radiculopathy, lumbar region: Secondary | ICD-10-CM | POA: Diagnosis not present

## 2018-12-25 DIAGNOSIS — M5416 Radiculopathy, lumbar region: Secondary | ICD-10-CM | POA: Diagnosis not present

## 2018-12-27 DIAGNOSIS — M5416 Radiculopathy, lumbar region: Secondary | ICD-10-CM | POA: Diagnosis not present

## 2018-12-28 DIAGNOSIS — M4316 Spondylolisthesis, lumbar region: Secondary | ICD-10-CM | POA: Diagnosis not present

## 2018-12-28 DIAGNOSIS — M48062 Spinal stenosis, lumbar region with neurogenic claudication: Secondary | ICD-10-CM | POA: Diagnosis not present

## 2019-01-01 DIAGNOSIS — M5416 Radiculopathy, lumbar region: Secondary | ICD-10-CM | POA: Diagnosis not present

## 2019-01-03 DIAGNOSIS — M5116 Intervertebral disc disorders with radiculopathy, lumbar region: Secondary | ICD-10-CM | POA: Diagnosis not present

## 2019-01-03 DIAGNOSIS — M5416 Radiculopathy, lumbar region: Secondary | ICD-10-CM | POA: Diagnosis not present

## 2019-01-04 ENCOUNTER — Other Ambulatory Visit: Payer: Self-pay | Admitting: Neurological Surgery

## 2019-01-04 DIAGNOSIS — M48062 Spinal stenosis, lumbar region with neurogenic claudication: Secondary | ICD-10-CM

## 2019-01-08 DIAGNOSIS — M5416 Radiculopathy, lumbar region: Secondary | ICD-10-CM | POA: Diagnosis not present

## 2019-01-09 ENCOUNTER — Ambulatory Visit
Admission: RE | Admit: 2019-01-09 | Discharge: 2019-01-09 | Disposition: A | Discharge status: Home / Self Care | Payer: PPO | Source: Ambulatory Visit | Attending: Neurological Surgery | Admitting: Neurological Surgery

## 2019-01-09 DIAGNOSIS — M25561 Pain in right knee: Secondary | ICD-10-CM | POA: Diagnosis not present

## 2019-01-09 DIAGNOSIS — M5416 Radiculopathy, lumbar region: Secondary | ICD-10-CM | POA: Diagnosis not present

## 2019-01-09 DIAGNOSIS — M48061 Spinal stenosis, lumbar region without neurogenic claudication: Secondary | ICD-10-CM | POA: Diagnosis not present

## 2019-01-09 DIAGNOSIS — M48062 Spinal stenosis, lumbar region with neurogenic claudication: Secondary | ICD-10-CM

## 2019-01-11 ENCOUNTER — Other Ambulatory Visit: Payer: Self-pay

## 2019-01-11 ENCOUNTER — Ambulatory Visit
Admission: RE | Admit: 2019-01-11 | Discharge: 2019-01-11 | Disposition: A | Discharge status: Home / Self Care | Payer: PPO | Source: Ambulatory Visit | Attending: Neurological Surgery | Admitting: Neurological Surgery

## 2019-01-11 DIAGNOSIS — M545 Low back pain: Secondary | ICD-10-CM | POA: Diagnosis not present

## 2019-01-11 DIAGNOSIS — M48062 Spinal stenosis, lumbar region with neurogenic claudication: Secondary | ICD-10-CM

## 2019-01-16 DIAGNOSIS — M4316 Spondylolisthesis, lumbar region: Secondary | ICD-10-CM | POA: Diagnosis not present

## 2019-01-16 DIAGNOSIS — R030 Elevated blood-pressure reading, without diagnosis of hypertension: Secondary | ICD-10-CM | POA: Diagnosis not present

## 2019-01-16 DIAGNOSIS — M48062 Spinal stenosis, lumbar region with neurogenic claudication: Secondary | ICD-10-CM | POA: Diagnosis not present

## 2019-02-13 DIAGNOSIS — E039 Hypothyroidism, unspecified: Secondary | ICD-10-CM | POA: Diagnosis not present

## 2019-02-13 DIAGNOSIS — E349 Endocrine disorder, unspecified: Secondary | ICD-10-CM | POA: Diagnosis not present

## 2019-02-13 DIAGNOSIS — E785 Hyperlipidemia, unspecified: Secondary | ICD-10-CM | POA: Diagnosis not present

## 2019-02-27 DIAGNOSIS — M48062 Spinal stenosis, lumbar region with neurogenic claudication: Secondary | ICD-10-CM | POA: Diagnosis not present

## 2019-03-05 DIAGNOSIS — E7800 Pure hypercholesterolemia, unspecified: Secondary | ICD-10-CM | POA: Diagnosis not present

## 2019-03-05 DIAGNOSIS — M5136 Other intervertebral disc degeneration, lumbar region: Secondary | ICD-10-CM | POA: Diagnosis not present

## 2019-03-05 DIAGNOSIS — E039 Hypothyroidism, unspecified: Secondary | ICD-10-CM | POA: Diagnosis not present

## 2019-03-05 DIAGNOSIS — R413 Other amnesia: Secondary | ICD-10-CM | POA: Diagnosis not present

## 2019-03-05 DIAGNOSIS — Z87898 Personal history of other specified conditions: Secondary | ICD-10-CM | POA: Diagnosis not present

## 2019-03-05 DIAGNOSIS — E559 Vitamin D deficiency, unspecified: Secondary | ICD-10-CM | POA: Diagnosis not present

## 2019-03-05 DIAGNOSIS — M5416 Radiculopathy, lumbar region: Secondary | ICD-10-CM | POA: Diagnosis not present

## 2019-03-05 DIAGNOSIS — F419 Anxiety disorder, unspecified: Secondary | ICD-10-CM | POA: Diagnosis not present

## 2019-03-08 DIAGNOSIS — R413 Other amnesia: Secondary | ICD-10-CM | POA: Diagnosis not present

## 2019-03-08 DIAGNOSIS — E559 Vitamin D deficiency, unspecified: Secondary | ICD-10-CM | POA: Diagnosis not present

## 2019-03-08 DIAGNOSIS — E039 Hypothyroidism, unspecified: Secondary | ICD-10-CM | POA: Diagnosis not present

## 2019-03-08 DIAGNOSIS — E7800 Pure hypercholesterolemia, unspecified: Secondary | ICD-10-CM | POA: Diagnosis not present

## 2019-03-15 DIAGNOSIS — R413 Other amnesia: Secondary | ICD-10-CM | POA: Diagnosis not present

## 2019-03-15 DIAGNOSIS — E039 Hypothyroidism, unspecified: Secondary | ICD-10-CM | POA: Diagnosis not present

## 2019-04-29 DIAGNOSIS — E039 Hypothyroidism, unspecified: Secondary | ICD-10-CM | POA: Diagnosis not present

## 2019-05-13 NOTE — Progress Notes (Signed)
[facility]    Provider:  Dr Lucia Gaskins Requesting Provider: [name], PA Primary Care Provider:  [name], PA  CC:  Memory loss  HPI:  Amanda Solis is a 69 y.o. female here as requested by [name], PA for memory changes with a strong family history of Alzheimer's disease.  She has a past medical history of thyroid disease/hypothyroidism, hyperlipidemia, headache, chronic low back pain and spinal stenosis ([name]), anxiety, insomnia, arthritis, anxiety, insomnia, family history of Alzheimer's disease.  I reviewed [name], patient complaining of recent decline in memory, her thyroid had continued to remain low despite reductions in dosing of levothyroxine and she was referred to endocrinologist, due to reduced dose medication and her TSH remaining high and concerns with memory decline she was sent to her hormonal specialist in [place] who has been prescribing her multiple hormones and treatments over the last 6 months, also managing her thyroid levels,, unfortunately the patient has not seen any improvement in fatigue or memory decline.  Per notes CBC, CMP, TSH, vitamin D, vitamin B12 and lipid panel all were essentially normal (TSH 0.88) labs drawn March 05, 2019.  I did review these labs and agree that they were essentially normal except for vitamin B12 at 219 which can still signify B12 deficiency and we will have to retest that along with methylmalonic acid.  On review of multiple other notes, patient complaining of memory declined, often forgetting things, short-term and longer term, she misplaces items, worried about Alzheimer's given her family history, she also has a history of anxiety on Lexapro, hot flashes has resolved, insomnia, she also has chronic pain severe stenosis L4-L5 and receiving injections with [name] and declined surgery.  I also reviewed several years of epic notes for more history and memory loss which I did  not find.  Also did not find anything in care everywhere.  No imaging of the brain as far as I can see.  Here with her husband who also provides information. Started a year ago slowly with forgetting things, repeating things, they saw a metabolic specialist in [place] and they were referred here. She loses her phone quite often. No problems driving, she was lost once and is not driving as much, she does all the accounting of the bills and not missing bills, she checks everything thoroughly and keeps tracks of credit cards, keeps a spreadsheet very detailed, not missing things, she manages her own medication. She feels worse since Covid, more isolated, she is worried about Covid, she has chronic low back pain, she is worried about anesthesia and will not get surgery on her back, [name] is oldest sister close to 53 in [place] and within the last few years but unclear if diagnosed with dementia, her brother had dementia in a nursing home unclear, mother had dementia totally didn;t know people or talk but unknown type of dementia. SHe maybe feels her knee surgery 2 years ago may have started her symptoms, now she has chronic pain. Long-term is fine, short-term is more affected. Denies depression, she has some anxiety. She appears to have mood changes, personality changes, more emotional, no hallunications or delusions.   Reviewed notes, labs and imaging from outside physicians, which showed: see above  Review of Systems: Patient complains of symptoms per HPI as well as the following symptoms: anxiety. Pertinent negatives and positives per HPI. All others negative.   Social History   Socioeconomic History  . Marital  status: Married    Spouse name: Not on file  . Number of children: 3  . Years of education: 60  . Highest education level: Not on file  Occupational History  . Not on file  Tobacco Use  . Smoking status: Former Smoker    Packs/day: 0.25    Years: 2.00    Pack years: 0.50     Types: Cigarettes    Quit date: 1978    Years since quitting: 43.0  . Smokeless tobacco: Never Used  Substance and Sexual Activity  . Alcohol use: Yes    Alcohol/week: 1.0 standard drinks    Types: 1 Glasses of wine per week  . Drug use: Never  . Sexual activity: Yes    Birth control/protection: Surgical  Other Topics Concern  . Not on file  Social History Narrative   Lives at home with spouse   Right handed   Caffeine: 2 cups coffee/day   Social Determinants of Health   Financial Resource Strain:   . Difficulty of Paying Living Expenses: Not on file  Food Insecurity:   . Worried About Charity fundraiser in the Last Year: Not on file  . Ran Out of Food in the Last Year: Not on file  Transportation Needs:   . Lack of Transportation (Medical): Not on file  . Lack of Transportation (Non-Medical): Not on file  Physical Activity:   . Days of Exercise per Week: Not on file  . Minutes of Exercise per Session: Not on file  Stress:   . Feeling of Stress : Not on file  Social Connections:   . Frequency of Communication with Friends and Family: Not on file  . Frequency of Social Gatherings with Friends and Family: Not on file  . Attends Religious Services: Not on file  . Active Member of Clubs or Organizations: Not on file  . Attends Archivist Meetings: Not on file  . Marital Status: Not on file  Intimate Partner Violence:   . Fear of Current or Ex-Partner: Not on file  . Emotionally Abused: Not on file  . Physically Abused: Not on file  . Sexually Abused: Not on file    Family History  Problem Relation Age of Onset  . Diabetes Mother   . Alzheimer's disease Mother   . Prostate cancer Father   . Alcohol abuse Father   . Diabetes Father   . Breast cancer Sister   . Alzheimer's disease Sibling   . Alzheimer's disease Sibling   . Colon cancer Neg Hx     Past Medical History:  Diagnosis Date  . Anxiety   . Arthritis   . Diverticulitis    in the past    . Headache   . Hyperlipidemia   . Hypothyroidism   . Thyroid disease     Patient Active Problem List   Diagnosis Date Noted  . Status post total knee replacement, right 09/15/2017  . Viral URI with cough 04/19/2017  . Family history of Alzheimer's disease 09/07/2016  . Family history of breast cancer in sister 03/05/2016  . Family history of diabetes mellitus in father 03/05/2016  . S/P cholecystectomy in 1978 03/02/2016  . HLD (hyperlipidemia) 03/02/2016  . Hypothyroidism 03/02/2016  . Vitamin D deficiency 03/02/2016  . Osteopenia 03/02/2016  . Insomnia 03/02/2016  . h/o Diverticulitis of colon without hemorrhage 2016 03/06/2015    Past Surgical History:  Procedure Laterality Date  . APPENDECTOMY    . CHOLECYSTECTOMY    .  TOTAL KNEE ARTHROPLASTY Right 09/15/2017   Procedure: RIGHT TOTAL KNEE ARTHROPLASTY;  Surgeon: Beverely Low, MD;  Location: [facility] OR;  Service: Orthopedics;  Laterality: Right;  . TUBAL LIGATION    . VARICOSE VEIN SURGERY      Current Outpatient Medications  Medication Sig Dispense Refill  . Ascorbic Acid (VITAMIN C PO) Take 500 mg by mouth daily.    . Cholecalciferol (VITAMIN D3 PO) Take 50 mcg by mouth daily.    . Cyanocobalamin (VITAMIN B-12 PO) Take 2,500 mcg by mouth daily.    Marland Kitchen levothyroxine (SYNTHROID) 75 MCG tablet Take 75 mcg by mouth daily.    . simvastatin (ZOCOR) 20 MG tablet TAKE 1 TABLET BY MOUTH DAILY AT 6 PM. PATIENT NEEDS OFFICE VISIT FOR FURTHER REFILLS 15 tablet 0  . donepezil (ARICEPT) 5 MG tablet Take 1 tablet (5 mg total) by mouth at bedtime. 30 tablet 6   No current facility-administered medications for this visit.    Allergies as of 05/14/2019  . (No Known Allergies)    Vitals: BP 137/84 (BP Location: Right Arm, Patient Position: Sitting)   Pulse 71   Temp (!) 97.5 F (36.4 C) Comment: husband 96.8; both taken at front  Ht 5\' 1"  (1.549 m)   Wt 124 lb (56.2 kg)   BMI 23.43 kg/m  Last Weight:  Wt Readings from Last 1  Encounters:  05/14/19 124 lb (56.2 kg)   Last Height:   Ht Readings from Last 1 Encounters:  05/14/19 5\' 1"  (1.549 m)     Physical exam: Exam: Gen: NAD, conversant, well nourised, well groomed, slightly anxious and teary     CV: RRR, no MRG. No Carotid Bruits. No peripheral edema, warm, nontender Eyes: Conjunctivae clear without exudates or hemorrhage  Neuro: Detailed Neurologic Exam  Speech:    Speech is normal; fluent and spontaneous with normal comprehension.  Cognition:  MMSE - Mini Mental State Exam 05/14/2019 09/07/2016  Orientation to time 4 5  Orientation to Place 5 5  Registration 3 3  Attention/ Calculation 5 5  Recall 2 2  Language- name 2 objects 2 2  Language- repeat 1 1  Language- follow 3 step command 3 3  Language- read & follow direction 1 1  Write a sentence 1 1  Copy design 0 1  Total score 27 29       The patient is oriented to person, place, and time;     recent and remote memory intact;     language fluent;     normal attention, concentration,     fund of knowledge Cranial Nerves:    The pupils are equal, round, and reactive to light.  Attempted funduscopy could not visualize due to small pupils. Visual fields are full to finger confrontation. Extraocular movements are intact. Trigeminal sensation is intact and the muscles of mastication are normal. The face is symmetric. The palate elevates in the midline. Hearing intact. Voice is normal. Shoulder shrug is normal. The tongue has normal motion without fasciculations.   Coordination:    Normal finger to nose and heel to shin.   Gait:    Normal native gait  Motor Observation:    No asymmetry, no atrophy, and no involuntary movements noted. Tone:    Normal muscle tone.    Posture:    Posture is normal. normal erect    Strength:    Strength is V/V in the upper and lower limbs.      Sensation: intact to LT  Reflex Exam:  DTR's:    Deep tendon reflexes in the upper and lower  extremities are symmetrical bilaterally.   Toes:    The toes are downgoing bilaterally.   Clonus:    Clonus is absent.    Assessment/Plan: This is a really lovely 69 year old patient here for with worries of memory loss.  She is here with her husband.  She reports dementia and her mother and siblings.  She is quite worried about her memory.  Mini-Mental status exam was normal.  I feel given her family history and how concerned she is in symptoms of short-term memory loss it does warrant evaluation including MRI of the brain, blood work and formal neuro cognitive testing.  They are very interested in starting medication and despite not having a dementia diagnosis I think given her family history and her symptoms we can start a low-dose of Aricept.  Husband here with her today and provides much information as well.   Orders Placed This Encounter  Procedures  . MR BRAIN W WO CONTRAST  . B12 and Folate Panel  . Methylmalonic acid, serum  . RPR  . Vitamin B1  . Homocysteine  . Basic Metabolic Panel  . Ambulatory referral to Neuropsychology   - Per notes CBC, CMP, TSH, vitamin D, vitamin B12 and lipid panel all were essentially normal (TSH 0.88) labs drawn March 05, 2019.  I did review these labs and agree that they were essentially normal except for vitamin B12 at 219 which can still signify B12 deficiency and we will have to retest that along with methylmalonic acid.  Also I do think her cholesterol was slightly elevated at 219 and LDL 111 and she should follow-up with primary care.   Orders Placed This Encounter  Procedures  . MR BRAIN W WO CONTRAST  . B12 and Folate Panel  . Methylmalonic acid, serum  . RPR  . Vitamin B1  . Homocysteine  . Basic Metabolic Panel  . Ambulatory referral to Neuropsychology   Meds ordered this encounter  Medications  . donepezil (ARICEPT) 5 MG tablet    Sig: Take 1 tablet (5 mg total) by mouth at bedtime.    Dispense:  30 tablet    Refill:  6     Cc: [name], PA,  [name], [place]  Naomie Dean, MD  [facility] [address] [place], [place] [address]  Phone [phone] Fax [phone]

## 2019-05-14 ENCOUNTER — Other Ambulatory Visit: Payer: Self-pay

## 2019-05-14 ENCOUNTER — Ambulatory Visit: Payer: PPO | Admitting: Neurology

## 2019-05-14 ENCOUNTER — Encounter: Payer: Self-pay | Admitting: Neurology

## 2019-05-14 VITALS — BP 137/84 | HR 71 | Temp 97.5°F | Ht 61.0 in | Wt 124.0 lb

## 2019-05-14 DIAGNOSIS — M5136 Other intervertebral disc degeneration, lumbar region: Secondary | ICD-10-CM | POA: Diagnosis not present

## 2019-05-14 DIAGNOSIS — R413 Other amnesia: Secondary | ICD-10-CM | POA: Diagnosis not present

## 2019-05-14 DIAGNOSIS — Z818 Family history of other mental and behavioral disorders: Secondary | ICD-10-CM

## 2019-05-14 MED ORDER — DONEPEZIL HCL 5 MG PO TABS: 5.0000 mg | ORAL_TABLET | Freq: Every day | ORAL | 6 refills | Status: DC

## 2019-05-14 NOTE — Patient Instructions (Addendum)
MRI of the brain w/wo contrast Formal neurocognitive testing Bloodwork  Donepezil tablets What is this medicine? DONEPEZIL (doe NEP e zil) is used to treat mild to moderate dementia caused by Alzheimer's disease. This medicine may be used for other purposes; ask your health care provider or pharmacist if you have questions. COMMON BRAND NAME(S): Aricept What should I tell my health care provider before I take this medicine? They need to know if you have any of these conditions:  asthma or other lung disease  difficulty passing urine  head injury  heart disease  history of irregular heartbeat  liver disease  seizures (convulsions)  stomach or intestinal disease, ulcers or stomach bleeding  an unusual or allergic reaction to donepezil, other medicines, foods, dyes, or preservatives  pregnant or trying to get pregnant  breast-feeding How should I use this medicine? Take this medicine by mouth with a glass of water. Follow the directions on the prescription label. You may take this medicine with or without food. Take this medicine at regular intervals. This medicine is usually taken before bedtime. Do not take it more often than directed. Continue to take your medicine even if you feel better. Do not stop taking except on your doctor's advice. If you are taking the 23 mg donepezil tablet, swallow it whole; do not cut, crush, or chew it. Talk to your pediatrician regarding the use of this medicine in children. Special care may be needed. Overdosage: If you think you have taken too much of this medicine contact a poison control center or emergency room at once. NOTE: This medicine is only for you. Do not share this medicine with others. What if I miss a dose? If you miss a dose, take it as soon as you can. If it is almost time for your next dose, take only that dose, do not take double or extra doses. What may interact with this medicine? Do not take this medicine with any of the  following medications:  certain medicines for fungal infections like itraconazole, fluconazole, posaconazole, and voriconazole  cisapride  dextromethorphan; quinidine  dronedarone  pimozide  quinidine  thioridazine This medicine may also interact with the following medications:  antihistamines for allergy, cough and cold  atropine  bethanechol  carbamazepine  certain medicines for bladder problems like oxybutynin, tolterodine  certain medicines for Parkinson's disease like benztropine, trihexyphenidyl  certain medicines for stomach problems like dicyclomine, hyoscyamine  certain medicines for travel sickness like scopolamine  dexamethasone  dofetilide  ipratropium  NSAIDs, medicines for pain and inflammation, like ibuprofen or naproxen  other medicines for Alzheimer's disease  other medicines that prolong the QT interval (cause an abnormal heart rhythm)  phenobarbital  phenytoin  rifampin, rifabutin or rifapentine  ziprasidone This list may not describe all possible interactions. Give your health care provider a list of all the medicines, herbs, non-prescription drugs, or dietary supplements you use. Also tell them if you smoke, drink alcohol, or use illegal drugs. Some items may interact with your medicine. What should I watch for while using this medicine? Visit your doctor or health care professional for regular checks on your progress. Check with your doctor or health care professional if your symptoms do not get better or if they get worse. You may get drowsy or dizzy. Do not drive, use machinery, or do anything that needs mental alertness until you know how this drug affects you. What side effects may I notice from receiving this medicine? Side effects that you should report to  your doctor or health care professional as soon as possible:  allergic reactions like skin rash, itching or hives, swelling of the face, lips, or tongue  feeling faint or  lightheaded, falls  loss of bladder control  seizures  signs and symptoms of a dangerous change in heartbeat or heart rhythm like chest pain; dizziness; fast or irregular heartbeat; palpitations; feeling faint or lightheaded, falls; breathing problems  signs and symptoms of infection like fever or chills; cough; sore throat; pain or trouble passing urine  signs and symptoms of liver injury like dark yellow or brown urine; general ill feeling or flu-like symptoms; light-colored stools; loss of appetite; nausea; right upper belly pain; unusually weak or tired; yellowing of the eyes or skin  slow heartbeat or palpitations  unusual bleeding or bruising  vomiting Side effects that usually do not require medical attention (report to your doctor or health care professional if they continue or are bothersome):  diarrhea, especially when starting treatment  headache  loss of appetite  muscle cramps  nausea  stomach upset This list may not describe all possible side effects. Call your doctor for medical advice about side effects. You may report side effects to FDA at 1-800-FDA-1088. Where should I keep my medicine? Keep out of reach of children. Store at room temperature between 15 and 30 degrees C (59 and 86 degrees F). Throw away any unused medicine after the expiration date. NOTE: This sheet is a summary. It may not cover all possible information. If you have questions about this medicine, talk to your doctor, pharmacist, or health care provider.  2020 Elsevier/Gold Standard (2018-04-02 10:33:41)   Dementia Dementia is a condition that affects the way the brain functions. It often affects memory and thinking. Usually, dementia gets worse with time and cannot be reversed (progressive dementia). There are many types of dementia, including:  Alzheimer's disease. This type is the most common.  Vascular dementia. This type may happen as the result of a stroke.  Lewy body dementia.  This type may happen to people who have Parkinson's disease.  Frontotemporal dementia. This type is caused by damage to nerve cells (neurons) in certain parts of the brain. Some people may be affected by more than one type of dementia. This is called mixed dementia. What are the causes? Dementia is caused by damage to cells in the brain. The area of the brain and the types of cells damaged determine the type of dementia. Usually, this damage is irreversible or cannot be undone. Some examples of irreversible causes include:  Conditions that affect the blood vessels of the brain, such as diabetes, heart disease, or blood vessel disease.  Genetic mutations. In some cases, changes in the brain may be caused by another condition and can be reversed or slowed. Some examples of reversible causes include:  Injury to the brain.  Certain medicines.  Infection, such as meningitis.  Metabolic problems, such as vitamin B12 deficiency or thyroid disease.  Pressure on the brain, such as from a tumor or blood clot. What are the signs or symptoms? Symptoms of dementia depend on the type of dementia. Common signs of dementia include problems with remembering, thinking, problem solving, decision making, and communicating. These signs develop slowly or get worse with time. This may include:  Problems remembering things.  Having trouble taking a bath or putting clothes on.  Forgetting appointments.  Forgetting to pay bills.  Difficulty planning and preparing meals.  Having trouble speaking.  Getting lost easily. How is  this diagnosed? This condition is diagnosed by a specialist (neurologist). It is diagnosed based on the history of your symptoms, your medical history, a physical exam, and tests. Tests may include:  Tests to evaluate brain function, such as memory tests, cognitive tests, and other tests.  Lab tests, such as blood or urine tests.  Imaging tests, such as a CT scan, a PET scan, or  an MRI.  Genetic testing. This may be done if other family members have a diagnosis of certain types of dementia. Your health care provider will talk with you and your family, friends, or caregivers about your history and symptoms. How is this treated?  Treatment for this condition depends on the cause of the dementia. Progressive dementias, such as Alzheimer's disease, cannot be cured, but there may be treatments that help to manage symptoms. Treatment might involve taking medicines that may help to:  Control the dementia.  Slow down the progression of the dementia.  Manage symptoms. In some cases, treating the cause of your dementia can improve symptoms, reverse symptoms, or slow down how quickly your dementia becomes worse. Your health care provider can direct you to support groups, organizations, and other health care providers who can help with decisions about your care. Follow these instructions at home: Medicines  Take over-the-counter and prescription medicines only as told by your health care provider.  Use a pill organizer or pill reminder to help you manage your medicines.  Avoid taking medicines that can affect thinking, such as pain medicines or sleeping medicines. Lifestyle  Make healthy lifestyle choices. ? Be physically active as told by your health care provider. ? Do not use any products that contain nicotine or tobacco, such as cigarettes, e-cigarettes, and chewing tobacco. If you need help quitting, ask your health care provider. ? Do not drink alcohol. ? Practice stress-management techniques when you get stressed. ? Spend time with other people.  Make sure to get quality sleep. These tips can help you get a good night's rest: ? Avoid napping during the day. ? Keep your sleeping area dark and cool. ? Avoid exercising during the few hours before you go to bed. ? Avoid caffeine products in the evening. Eating and drinking  Drink enough fluid to keep your  urine pale yellow.  Eat a healthy diet. General instructions   Work with your health care provider to determine what you need help with and what your safety needs are.  Talk with your health care provider about whether it is safe for you to drive.  If you were given a bracelet that identifies you as a person with memory loss or tracks your location, make sure to wear it at all times.  Work with your family to make important decisions, such as advance directives, medical power of attorney, or a living will.  Keep all follow-up visits as told by your health care provider. This is important. Where to find more information  Alzheimer's Association: LimitLaws.hu  [employer] on Aging: CashCowGambling.be  World Health Organization: https://castaneda-walker.com/ Contact a health care provider if:  You have any new or worsening symptoms.  You have problems with choking or swallowing. Get help right away if:  You feel depressed or sad, or feel that you want to harm yourself.  Your family members become concerned for your safety. If you ever feel like you may hurt yourself or others, or have thoughts about taking your own life, get help right away. You can go to your nearest emergency department  or call:  Your local emergency services (911 in the [place]  A suicide crisis helpline, such as the [facility] at 760-240-5806. This is open 24 hours a day. Summary  Dementia is a condition that affects the way the brain functions. Dementia often affects memory and thinking.  Usually, dementia gets worse with time and cannot be reversed (progressive dementia).  Treatment for this condition depends on the cause of the dementia.  Work with your health care provider to determine what you need help with and what your safety needs are.  Your health care provider can direct you to support groups, organizations, and other health care providers who can help with decisions  about your care. This information is not intended to replace advice given to you by your health care provider. Make sure you discuss any questions you have with your health care provider. Document Revised: 06/26/2018 Document Reviewed: 06/26/2018 Elsevier Patient Education  [place].

## 2019-05-17 ENCOUNTER — Ambulatory Visit: Payer: PPO | Attending: Internal Medicine

## 2019-05-17 DIAGNOSIS — Z23 Encounter for immunization: Secondary | ICD-10-CM

## 2019-05-17 NOTE — Progress Notes (Signed)
Labs thus far look fine, if anything comes back abnormal we will let you know thanks.

## 2019-05-18 LAB — BASIC METABOLIC PANEL
BUN/Creatinine Ratio: 23 (ref 12–28)
BUN: 18 mg/dL (ref 8–27)
CO2: 24 mmol/L (ref 20–29)
Calcium: 9.5 mg/dL (ref 8.7–10.3)
Chloride: 104 mmol/L (ref 96–106)
Creatinine, Ser: 0.79 mg/dL (ref 0.57–1.00)
GFR calc Af Amer: 90 mL/min/{1.73_m2} (ref >59)
GFR calc non Af Amer: 78 mL/min/{1.73_m2} (ref >59)
Glucose: 94 mg/dL (ref 65–99)
Potassium: 4.4 mmol/L (ref 3.5–5.2)
Sodium: 142 mmol/L (ref 134–144)

## 2019-05-18 LAB — METHYLMALONIC ACID, SERUM: Methylmalonic Acid: 114 nmol/L (ref 0–378)

## 2019-05-18 LAB — RPR: RPR Ser Ql: NONREACTIVE

## 2019-05-18 LAB — HOMOCYSTEINE: Homocysteine: 7.4 umol/L (ref 0.0–17.2)

## 2019-05-18 LAB — VITAMIN B1: Thiamine: 125.1 nmol/L (ref 66.5–200.0)

## 2019-05-18 LAB — B12 AND FOLATE PANEL
Folate: 16.9 ng/mL (ref >3.0)
Vitamin B-12: 1366 pg/mL — ABNORMAL HIGH (ref 232–1245)

## 2019-05-21 IMAGING — CR DG CHEST 2V
2 series · 2 of 2 positions shown · non-contrast
Comparison: None.

CLINICAL DATA: Chronic cough

EXAM:
CHEST - 2 VIEW

[w chest pa]
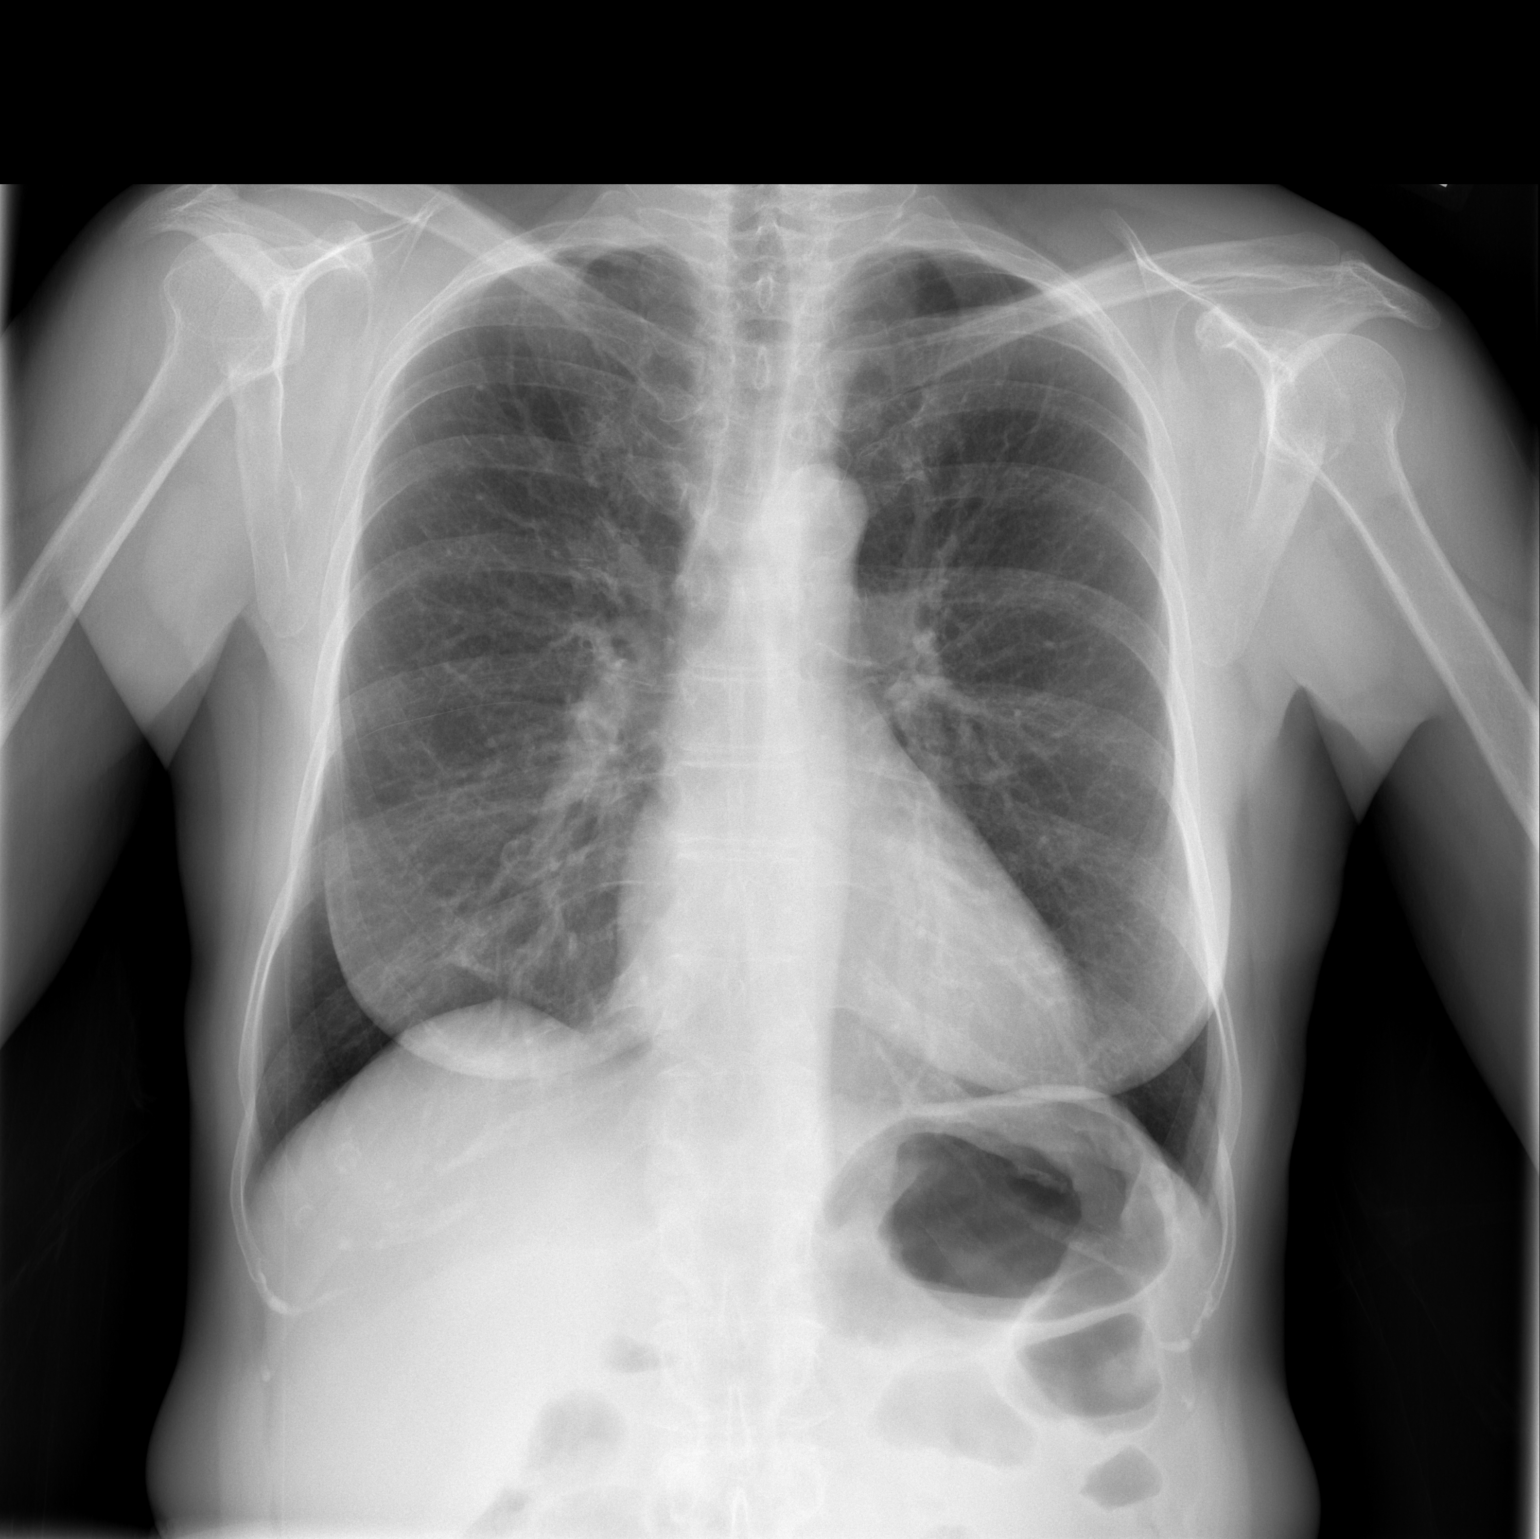

[w chest lat]
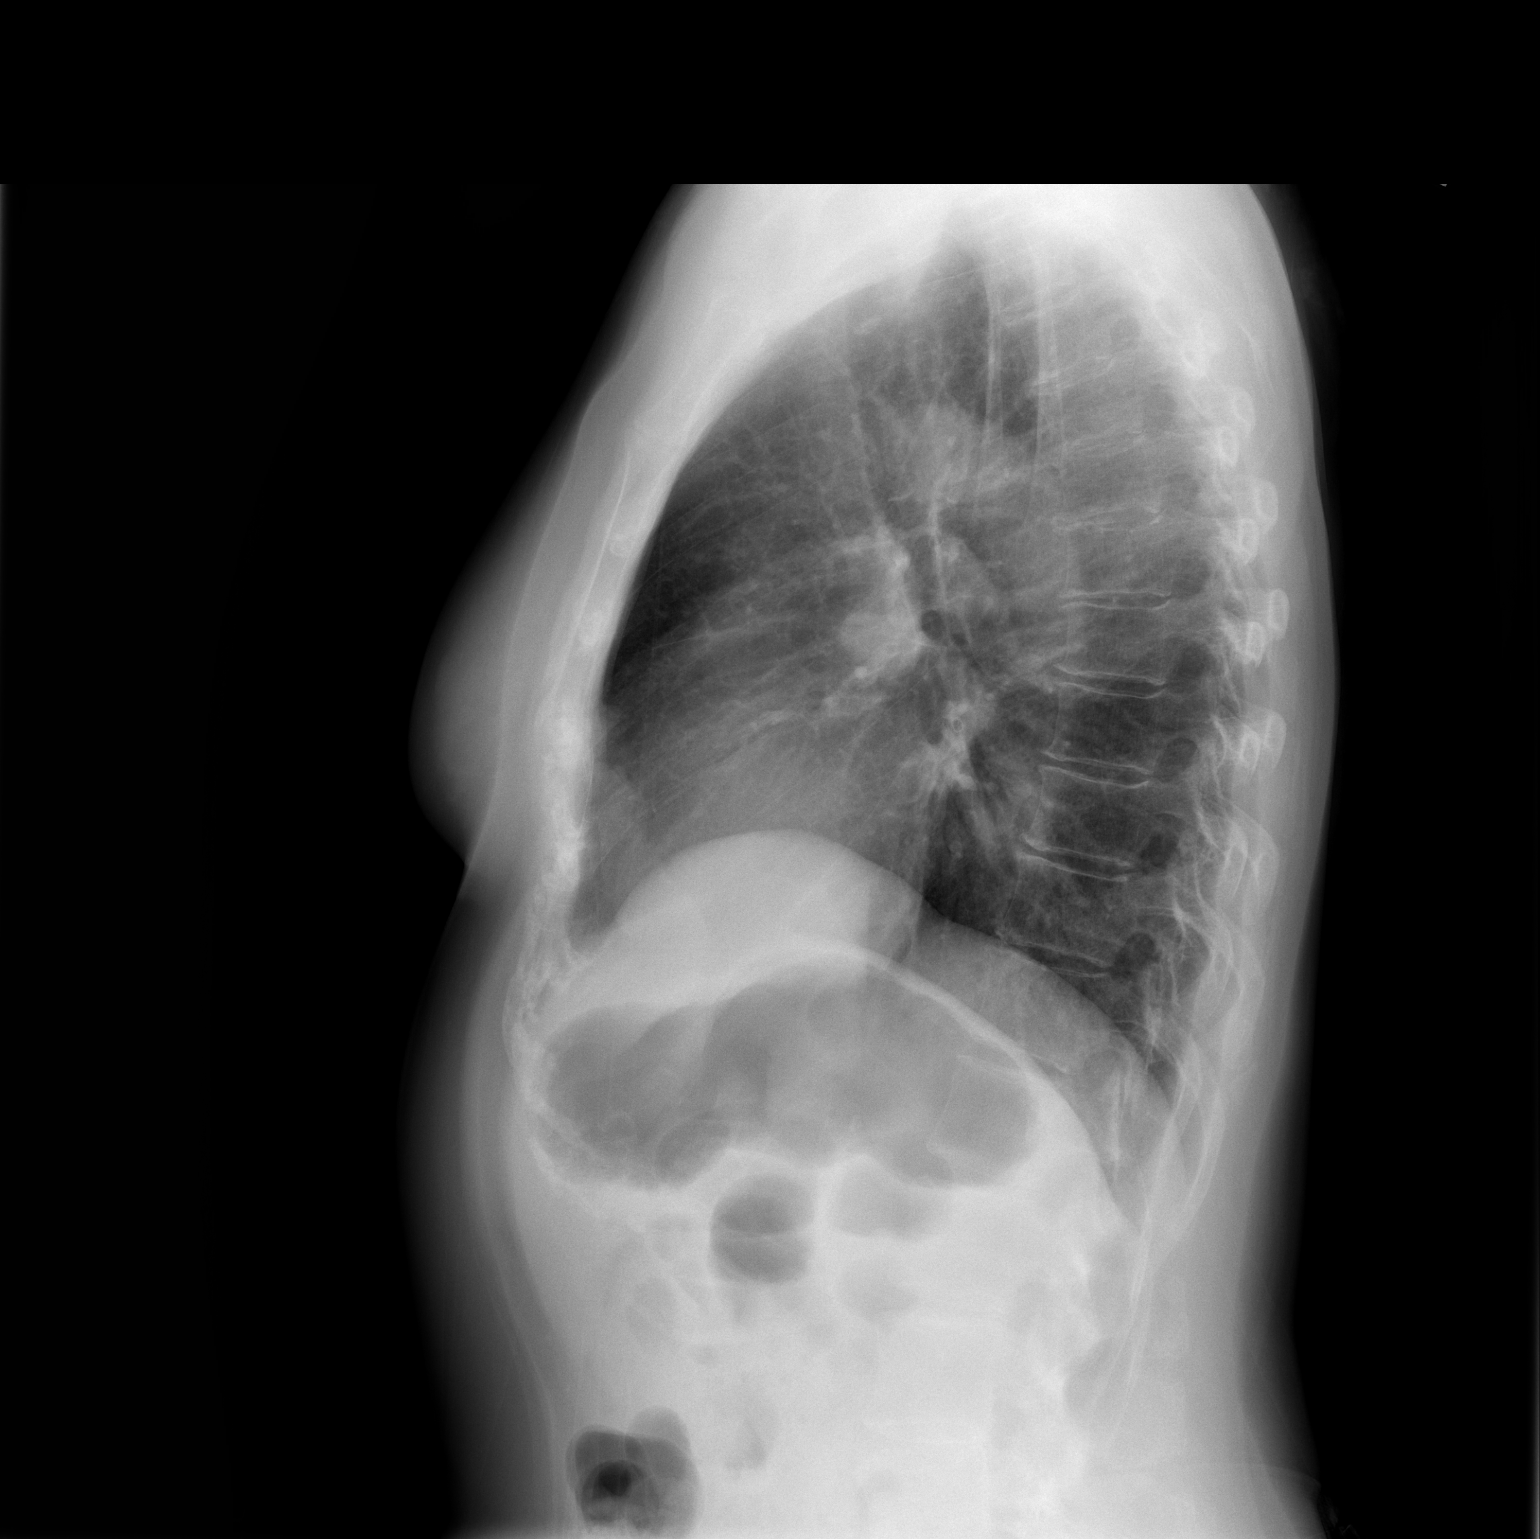

[2 of 2 positions shown; findings below may reference images not displayed]

FINDINGS: The heart size and mediastinal contours are within normal limits.
Both lungs are clear. The visualized skeletal structures are
unremarkable.
IMPRESSION: No active cardiopulmonary disease.

## 2019-06-05 ENCOUNTER — Ambulatory Visit
Admission: RE | Admit: 2019-06-05 | Discharge: 2019-06-05 | Disposition: A | Discharge status: Home / Self Care | Payer: PPO | Source: Ambulatory Visit | Attending: Neurology | Admitting: Neurology

## 2019-06-05 ENCOUNTER — Other Ambulatory Visit: Payer: Self-pay

## 2019-06-05 DIAGNOSIS — R413 Other amnesia: Secondary | ICD-10-CM | POA: Diagnosis not present

## 2019-06-05 MED ORDER — GADOBENATE DIMEGLUMINE 529 MG/ML IV SOLN
11.0000 mL | Freq: Once | INTRAVENOUS | Status: AC | PRN
  Administered 2019-06-05: 11 mL via INTRAVENOUS

## 2019-06-06 NOTE — Progress Notes (Signed)
MRI of the brain normal for age, great news. Have a wonderful weekend! Dr. Lucia Gaskins

## 2019-06-07 ENCOUNTER — Ambulatory Visit: Payer: PPO | Attending: Internal Medicine

## 2019-06-07 DIAGNOSIS — Z23 Encounter for immunization: Secondary | ICD-10-CM

## 2019-06-07 NOTE — Progress Notes (Signed)
   Cov[facility] Vaccination [facility]  Name:  Amanda Solis    MRN: [id number] DOB: February 25, 1951  06/07/2019  [name] was observed post Covid-19 immunization for 15 minutes without incidence. She was provided with Vaccine Information Sheet and instruction to access the V-Safe system.   [name] was instructed to call 911 with any severe reactions post vaccine: Marland Kitchen Difficulty breathing  . Swelling of your face and throat  . A fast heartbeat  . A bad rash all over your body  . Dizziness and weakness    Immunizations Administered    Name Date Dose VIS Date Route   Pfizer COVID-19 Vaccine 06/07/2019 10:20 AM 0.3 mL 04/05/2019 Intramuscular   Manufacturer: [employer], [employer]   Lot: [id number]   NDC: [address]-6

## 2019-07-17 DIAGNOSIS — M48062 Spinal stenosis, lumbar region with neurogenic claudication: Secondary | ICD-10-CM | POA: Diagnosis not present

## 2019-08-07 ENCOUNTER — Telehealth: Payer: Self-pay | Admitting: Neurology

## 2019-08-07 NOTE — Telephone Encounter (Signed)
Phone rep checked office voicemail's, husband(on DPR-[place],David) states pt has yet to be contacted about the memory test/study they were told Dr Lucia Gaskins would order.  Please call

## 2019-08-08 NOTE — Telephone Encounter (Signed)
From January I have no Idea I called Patient's Husband and asked His wife scheduled with Dr. Silvano Rusk where she could get him in a few weeks that way she could have have testing before she came back to see . Dr. Lucia Gaskins . I offered to scheduled again with [facility] and [facility]. He didn't want to that either. I offered everything I could to him.

## 2019-08-14 DIAGNOSIS — M48062 Spinal stenosis, lumbar region with neurogenic claudication: Secondary | ICD-10-CM | POA: Diagnosis not present

## 2019-08-14 NOTE — Telephone Encounter (Signed)
Is she scheduled with rodenbough? If so make sure my follow up appointment is after that thnks

## 2019-08-14 NOTE — Telephone Encounter (Signed)
If patient is not interested in complying with the memory testing then I cannot help them and return them to pcp, cancel appointment with me thanks

## 2019-08-15 ENCOUNTER — Encounter: Payer: Self-pay | Admitting: Counselor

## 2019-08-15 NOTE — Telephone Encounter (Signed)
Called and spoke to Gilberts at Dr.Merez office and she is going to call patient with a sooner apt. I relayed to Annabelle Harman that she will have to speak to patient's husband Annabelle Harman understood.  I will then get patient a follow up apt with Amy .

## 2019-08-15 NOTE — Telephone Encounter (Signed)
I have talked to patient 's husband he is aware of [facility]pt and he is scheduled with Amy 10/01/2019 .

## 2019-08-15 NOTE — Telephone Encounter (Addendum)
I spoke with the pt's husband. He was upset that he had not heard anything or had any follow up on the testing. He does not want to have testing in [place] even though they can get pt in within a few weeks. He did agree to schedule testing locally. He is aware they may be booked out. He said to cancel the May f/u with Dr. Lucia Gaskins and they will r/s when testing is done. I assured him we would personally follow up on the referral to make sure pt is scheduled. He verbalized appreciation.   I spoke with Annabelle Harman. She called Dr. Marvetta Gibbons office and LVM asking for call back ASAP.

## 2019-08-15 NOTE — Telephone Encounter (Signed)
Thank you :)

## 2019-08-27 DIAGNOSIS — M48062 Spinal stenosis, lumbar region with neurogenic claudication: Secondary | ICD-10-CM | POA: Diagnosis not present

## 2019-08-30 ENCOUNTER — Encounter: Payer: Self-pay | Admitting: Counselor

## 2019-08-30 ENCOUNTER — Ambulatory Visit (INDEPENDENT_AMBULATORY_CARE_PROVIDER_SITE_OTHER): Payer: PPO | Admitting: Counselor

## 2019-08-30 ENCOUNTER — Other Ambulatory Visit: Payer: Self-pay

## 2019-08-30 ENCOUNTER — Ambulatory Visit: Payer: PPO

## 2019-08-30 DIAGNOSIS — R413 Other amnesia: Secondary | ICD-10-CM

## 2019-08-30 DIAGNOSIS — F418 Other specified anxiety disorders: Secondary | ICD-10-CM

## 2019-08-30 DIAGNOSIS — G3184 Mild cognitive impairment, so stated: Secondary | ICD-10-CM

## 2019-08-30 NOTE — Progress Notes (Signed)
NEUROPSYCHOLOGICAL EVALUATION [facility]  Patient Name: Amanda Solis MRN: [id number] Date of Birth: 1951/02/05 Age: 69 y.o. Education: 13 years  Referral Circumstances and Background Information  Amanda Solis is a 69 y.o., right-hand dominant, married woman with a history of L4/L5 stenosis resulting in significant back pain, anxiety, insomnia, arthritis, family history of dementia, and memory problems that started just around a year and a half ago. She was referred by Dr. Lucia Gaskins at [facility] who is in the process of working up the patient's problems and started Aricept (as much due to patient request as concern, it sounds like). My thanks to Dr. Lucia Gaskins for her detailed notes, which were reviewed and are appreciated.  On interview, the patient and her husband corroborated that she started with memory problems about a year and a half ago. She will say the same thing over again, at times asks repetitive questions, and forgets things that they tell her. The patient herself was a bit anxious and terse today but she stated that she has noticed her problems. It sounds like the patient's husband and daughter think the problem is more severe than the patient thinks it is. Her husband thinks she has problems keeping track of the date but she doesn't. She doesn't forget entire events and there is no rapid forgetting of information. They haven't noticed any problems with language. They haven't noticed any visual hallucinations, delusions, or suspiciousness. She does seem to be "emotional" and to get upset about comments and she seems quite touchy today. With respect to mood, the patient appears somewhat agitated today. She is facing a 6 hour back surgery that she doesn't want to get (worried about general anaesthesia) and that is a significant stressor. She has a lot of pain. The patient admits to feeling frankly depressed, over the past few months, and a lot of that has to  do with her back pain. She doesn't feel helpless, hopeless, or worthless. She is still sleeping well, however, and stated that she usually gets 7 hours of sleep a night. She stated that her energy is "not the best." Her appetite is adequate and she isn't gaining or losing any weight. She thinks her pain level is typically around a 7 or 10 a day most days, including today.   With respect functioning, the patient is doing well, apparently and they denied that she is having problems forgetting bills or balancing the checkbook. They do have a lot of things on auto pay. She keeps a spreadsheet and sounds quite organized. She is still driving, although they reported one incident of her getting lost more than a year ago and needing to call her husband. She doesn't drive much and typically goes to the same places. They haven't noticed any changes in cooking or leaving pots on the stove. She is still remembering all her own medications. She misplaces her phone more than in the past but hasn't really lost it. They are largely denying that she has had to stop doing anything or has a hard time doing anything that she used to do as a result of cognitive problems.   Past Medical History and Review of Relevant Studies   Patient Active Problem List   Diagnosis Date Noted  . Status post total knee replacement, right 09/15/2017  . Viral URI with cough 04/19/2017  . Family history of Alzheimer's disease 09/07/2016  . Family history of breast cancer in sister 03/05/2016  . Family history of diabetes mellitus in  father 03/05/2016  . S/P cholecystectomy in 1978 03/02/2016  . HLD (hyperlipidemia) 03/02/2016  . Hypothyroidism 03/02/2016  . Vitamin D deficiency 03/02/2016  . Osteopenia 03/02/2016  . Insomnia 03/02/2016  . h/o Diverticulitis of colon without hemorrhage 2016 03/06/2015    Review of Neuroimaging and Relevant Medical History: :  The patient has an MRI from 06/05/2019 that shows a mild degree of volume  loss, I question on some cuts if there isn't just a bit more in the mesial temporal regions near the head of the hippocampus but it is a soft call and is not clearly out of proportion to volume loss in other areas.   Normal neurological exam with Dr. Lucia Gaskins in January, 2021.  The patient denied ever having a stroke, seizure, or head injury.   Current Outpatient Medications  Medication Sig Dispense Refill  . Ascorbic Acid (VITAMIN C PO) Take 500 mg by mouth daily.    . Cholecalciferol (VITAMIN D3 PO) Take 50 mcg by mouth daily.    . Cyanocobalamin (VITAMIN B-12 PO) Take 2,500 mcg by mouth daily.    Marland Kitchen donepezil (ARICEPT) 5 MG tablet Take 1 tablet (5 mg total) by mouth at bedtime. 30 tablet 6  . levothyroxine (SYNTHROID) 75 MCG tablet Take 75 mcg by mouth daily.    . simvastatin (ZOCOR) 20 MG tablet TAKE 1 TABLET BY MOUTH DAILY AT 6 PM. PATIENT NEEDS OFFICE VISIT FOR FURTHER REFILLS 15 tablet 0   No current facility-administered medications for this visit.   Family History  Problem Relation Age of Onset  . Diabetes Mother   . Alzheimer's disease Mother   . Prostate cancer Father   . Alcohol abuse Father   . Diabetes Father   . Breast cancer Sister   . Alzheimer's disease Sibling   . Alzheimer's disease Sibling   . Colon cancer Neg Hx    There is a family history of dementia. Her mother, and a brother, and a sister all developed dementia (2/8 children). She thinks they developed the condition in their early 85s, she wasn't sure what kind of dementia it was. Bother her brother and sister ended up in a nursing home in their early 92s late 80s. Her mother got the condition somewhat later in her 24s. There is no  family history of psychiatric illness.  Psychosocial History  Developmental, Educational and Employment History: The patient described herself as a good student who was never held back, had no learning difficulties, and graduated high school. She did one year of college in Early  Childhood Education, although she felt too far from home and discontinued. 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 She retired in 2009, her husband retired at that time and they wanted to be together.   Psychiatric History: The patient denied any history of mental health treatment or counseling. She also denied any history of mental health issues previously.   Substance Use History: The patient drinks occasionally, she does not use any drugs. She was previously a smoker, she quit over 25 years ago.   Relationship History and Living Cimcumstances: The patient and her husband been married 47 years. The patient and her husband do have some tension related to her memory problems but it still sounds like a supportive relationship. She has two daughters and a son, one of her daughters is quite involved and visits them frequently (Amy, their eldest). They have 6 grandchildren, some of  whom she sees regularly.   Mental Status and Behavioral Observations  Sensorium/Arousal: The patient's level of arousal was awake and alert. Hearing and vision were adequate for testing purposes. Orientation: The patient was fully oriented to person, place, time, and situation.  Appearance: Appeared in some pain and uncomfortable Behavior: The patient presented as quite anxious initially and complained of back pain. She did calm down as the evaluation progressed.  Speech/language: Normal in rate, rhythm, and volume.  Gait/Posture: Not observed; normal at last neurological exam.  Movement: The patient had no overt signs/symptoms of movement disorder, she was somewhat hyperkinetic, presumably related to discomfort.  Social Comportment: Appropriate within social norms Mood: The patient's mood was depressed Affect: Dysphoric Thought process/content: Thought process was logical, linear, and goal-directed. Thought content was appropriate to the topics discussed.    Safety: Patient denied any thoughts of harming herself or others on direct questioning.  Insight: Amanda Solis Cognitive Assessment  08/30/2019  Visuospatial/ Executive (0/5) 3  Naming (0/3) 3  Attention: Read list of digits (0/2) 1  Attention: Read list of letters (0/1) 1  Attention: Serial 7 subtraction starting at 100 (0/3) 3  Language: Repeat phrase (0/2) 2  Language : Fluency (0/1) 0  Abstraction (0/2) 1  Delayed Recall (0/5) 0  Orientation (0/6) 6  Total 20  Adjusted Score (based on education) 20   Test Procedures  Wide Range Achievement Test - 4             Word Reading Neuropsychological Assessment Battery  List Learning  Story Learning  Daily Living Memory  Naming  Digit Span Repeatable Battery for the Assessment of Neuropsychological Status (Form A)  Figure Copy  Judgment of Line Orientation  Coding  Figure Recall The Dot Counting Test A Random Letter Test Controlled Oral Word Association (F-A-S) Semantic Fluency (Animals) Trail Making Test A & B Complex Ideational Material Modified [place] Card Sorting Test Geriatric Depression Scale - Short Form Quick Dementia Rating System (completed by husband Waunita Schooner)  Plan  Amanda Solis was seen for a psychiatric diagnostic evaluation and neuropsychological testing. Full and complete note with impressions, recommendations, and interpretation of test data to follow.   Viviano Simas Nicole Kindred, PsyD, [place] Clinical Neuropsychologist  Informed Consent and Coding/Compliance  Risks and benefits of the evaluation were discussed with the patient prior to all testing procedures. I conducted a clinical interview and neuropsychological testing (at least two tests) with Amanda Solis and Lamar Benes, B.S. (Technician) assisted me in administering additional test procedures. The patient was able to tolerate the testing procedures and the patient (and/or family if applicable) is likely to benefit from further follow  up to receive the diagnosis and treatment recommendations, which will be rendered at the next encounter. Billing below reflects technician time, my direct face-to-face time with the patient, time spent in test administration, and time spent in professional activities including but not limited to: neuropsychological test interpretation, integration of neuropsychological test data with clinical history, report preparation, treatment planning, care coordination, and review of diagnostically pertinent medical history or studies.   Services associated with this encounter: Clinical Interview [phone]) plus 60 minutes (23536; Neuropsychological Evaluation by Professional)  120 minutes (14431; Neuropsychological Evaluation by Professional, Adl.) 30 minutes (54008; Test Administration by Professional) 30 minutes (67619; Neuropsychological Testing by Technician) 60 minutes (50932; Neuropsychological Testing by Technician, Adl.)

## 2019-08-30 NOTE — Progress Notes (Signed)
   Psychometrist Note   Cognitive testing was administered to [employer] by Lamar Benes, B.S. (Technician) under the supervision of Alphonzo Severance, Psy.D., ABN. Amanda Solis was able to tolerate all test procedures. Dr. Nicole Kindred met with the patient as needed to manage any emotional reactions to the testing procedures (if applicable). Rest breaks were offered.    The battery of tests administered was selected by Dr. Nicole Kindred with consideration to the patient's current level of functioning, the nature of her symptoms, emotional and behavioral responses during the interview, level of literacy, observed level of motivation/effort, and the nature of the referral question. This battery was communicated to the psychometrist. Communication between Dr. Nicole Kindred and the psychometrist was ongoing throughout the evaluation and Dr. Nicole Kindred was immediately accessible at all times. Dr. Nicole Kindred provided supervision to the technician on the date of this service, to the extent necessary to assure the quality of all services provided.    Amanda Solis will return in approximately one week for an interactive feedback session with Dr. Nicole Kindred, at which time female test performance, clinical impressions, and treatment recommendations will be reviewed in detail. The patient understands she can contact our office should she require our assistance before this time.   A total of 90 minutes of billable time were spent with Amanda Solis by the technician, including test administration and scoring time. Billing for these services is reflected in Dr. Les Pou note.   This note reflects time spent with the psychometrician and does not include test scores, clinical history, or any interpretations made by Dr. Nicole Kindred. The full report will follow in a separate note.

## 2019-09-03 ENCOUNTER — Encounter: Payer: Self-pay | Admitting: Counselor

## 2019-09-03 NOTE — Progress Notes (Signed)
NEUROPSYCHOLOGICAL TEST SCORES [facility]  Patient Name: Amanda Solis MRN: [id number] Date of Birth: 05-Aug-1950 Age: 69 y.o. Education: 13 years  Measurement properties of test scores: IQ, Index, and Standard Scores (SS): Mean = 100; Standard Deviation = 15 Scaled Scores (Ss): Mean = 10; Standard Deviation = 3 Z scores (Z): Mean = 0; Standard Deviation = 1 T scores (T); Mean = 50; Standard Deviation = 10  TEST SCORES:    Note: This summary of test scores accompanies the interpretive report and should not be considered in isolation without reference to the appropriate sections in the text. Test scores are relative to age, gender, and educational history as available and appropriate.   Performance Validity        "A" Random Letter Test Raw  Descriptor      Errors 0 Within Expectation  The Dot Counting Test: 7 Within Expectation      Mental Status Screening     Total Score Descriptor  MoCA 20 MCI      Expected Functioning        Wide Range Achievement Test: Standard/Scaled Score Percentile      Word Reading 109 73      Attention/Processing Speed        Neuropsychological Assessment Battery (Attention Module, Form 1): T-score Percentile      Digits Forward 48 42      Digits Backwards 49 46      Repeatable Battery for the Assessment of Neuropsychological Status (Form A): Scaled Score Percentile      Coding 10 50      Language        Neuropsychological Assessment Battery (Language Module, Form 1): T-score Percentile      Naming   (29) 45 31      Verbal Fluency: T-score Percentile      Controlled Oral Word Association (F-A-S) 37 9      Semantic Fluency (Animals) 42 21      Memory:        Neuropsychological Assessment Battery (Memory Module, Form 1): T-score Percentile      List Learning           List A Immediate Recall   (4, 6, 4) 31 3         List B Immediate Recall   (3) 41 18         List A Short Delayed Recall   (0) 19 <1         List A Long  Delayed Recall   (0) 24 <1         List A Long Delayed Yes/No Recognition Hits   (9) --- 12         List A Long Delayed Yes/No Recognition False Alarms   (16) --- <1         List A Recognition Discriminability Index --- <1     Story Learning           Immediate Recall   (12, 17) 25 1         Delayed Recall   (0) 27 1      Daily Living Memory            Immediate Recall   (17, 12) 32 4          Delayed Recall   (0, 0) 19 <1          Recognition Hits    (4) --- <1  Repeatable Battery for the Assessment of Neuropsychological Status (Form A): Scaled Score Percentile         Figure Recall   (0) 1 <1      Visuospatial/Constructional Functioning        Repeatable Battery for the Assessment of Neuropsychological Status (Form A): Standard/Scaled Score Percentile     Visuospatial/Constructional Index 96 39         Figure Copy   (20) 14 91         Judgment of Line Orientation   (11) --- 3-9      Executive Functioning        Modified [place] Card Sorting Test (MWCST): Standard/T-Score Percentile      Number of Categories Correct 57 76      Number of Perseverative Errors 65 94      Number of Total Errors 48 43      Percent Perseverative Errors 64 92  Executive Function Composite 118 89      Trail Making Test: T-Score Percentile      Part A 60 84      Part B 62 88      [place] Diagnostic Aphasia Exam: Raw Score Scaled Score      Complex Ideational Material 10 7      Clock Drawing Raw Score Descriptor      Command 9 WNL      Rating Scales         Raw Score Descriptor  Patient Health Questionnaire - 9 3 WNL  GAD-7 6 Mild Anxiety  Quick Dementia Rating System        Sum of Boxes 3 Very Mild Dementia      Total Score 5.5 MCI    Dealva Lafoy V. Nicole Kindred PsyD, [place] Clinical Neuropsychologist

## 2019-09-05 DIAGNOSIS — M48062 Spinal stenosis, lumbar region with neurogenic claudication: Secondary | ICD-10-CM | POA: Diagnosis not present

## 2019-09-06 ENCOUNTER — Ambulatory Visit (INDEPENDENT_AMBULATORY_CARE_PROVIDER_SITE_OTHER): Payer: PPO | Admitting: Counselor

## 2019-09-06 ENCOUNTER — Encounter: Payer: Self-pay | Admitting: Counselor

## 2019-09-06 ENCOUNTER — Other Ambulatory Visit: Payer: Self-pay

## 2019-09-06 DIAGNOSIS — G3184 Mild cognitive impairment, so stated: Secondary | ICD-10-CM

## 2019-09-06 NOTE — Progress Notes (Signed)
   [facility]  I met with Estevan Ryder Petersen to review the findings resulting from her neuropsychological evaluation. Since the last appointment, she has been about the same.Time was spent reviewing the impressions and recommendations that are detailed in the evaluation report. We discussed her diagnosis, expected prognosis, and importance of dietary and lifestyle changes to minimize/delay decline. We also discussed her back surgery and I shared my strong recommendation that they not overly worry about her MCI, which is quite mild at this point. She may be at higher risks for transient post procedural confusion but there is no contraindication in my opinion. Interventions provided during this encounter included psychoeducation, and other topics as reflected in the patient instructions. I took time to explain the findings and answer all the patient's questions. I encouraged Ms. Tippen to contact me should she have any further questions or if further follow up is desired.   Current Medications and Medical History   Current Outpatient Medications  Medication Sig Dispense Refill  . Ascorbic Acid (VITAMIN C PO) Take 500 mg by mouth daily.    . Cholecalciferol (VITAMIN D3 PO) Take 50 mcg by mouth daily.    . Cyanocobalamin (VITAMIN B-12 PO) Take 2,500 mcg by mouth daily.    Marland Kitchen donepezil (ARICEPT) 5 MG tablet Take 1 tablet (5 mg total) by mouth at bedtime. 30 tablet 6  . levothyroxine (SYNTHROID) 75 MCG tablet Take 75 mcg by mouth daily.    . simvastatin (ZOCOR) 20 MG tablet TAKE 1 TABLET BY MOUTH DAILY AT 6 PM. PATIENT NEEDS OFFICE VISIT FOR FURTHER REFILLS 15 tablet 0   No current facility-administered medications for this visit.    Patient Active Problem List   Diagnosis Date Noted  . Status post total knee replacement, right 09/15/2017  . Viral URI with cough 04/19/2017  . Family history of Alzheimer's disease 09/07/2016  . Family history of breast  cancer in sister 03/05/2016  . Family history of diabetes mellitus in father 03/05/2016  . S/P cholecystectomy in 1978 03/02/2016  . HLD (hyperlipidemia) 03/02/2016  . Hypothyroidism 03/02/2016  . Vitamin D deficiency 03/02/2016  . Osteopenia 03/02/2016  . Insomnia 03/02/2016  . h/o Diverticulitis of colon without hemorrhage 2016 03/06/2015    Mental Status and Behavioral Observations  Ms. Noguchi was available at the prespecified time for this appointment and presented as alert and oriented (orientation not formally assessed). Her self-reported mood was "Like I normally feel, I don't think I have a bad mood" and her affect was mainly neutral. Her thought process was logical, linear, and goal oriented, although she let her husband do most of the talking. Content was appropriate. No safety concerns were identified at today's encounter.   Plan  Feedback provided regarding the patient's neuropsychological evaluation. She presented as greatly appreciative of the dietary, lifestyle, and other modifications suggested. She will follow up with Dr. Jaynee Eagles. I reinforced with her that there are no cognitive contraindications to surgery from my perspective and that it is best for her to not worry excessively about cognitive complications, which would likely be transient. Estevan Ryder Spinola was encouraged to contact me if any questions arise or if further follow up is desired.   Viviano Simas Nicole Kindred, PsyD, ABN Clinical Neuropsychologist  Service(s) Provided at This Encounter: 50 minutes [phone]; Conjoint therapy with patient present)

## 2019-09-06 NOTE — Progress Notes (Signed)
NEUROPSYCHOLOGICAL EVALUATION [facility]  Patient Name: Amanda Solis MRN: [id number] Date of Birth: 06/10/1950 Age: 69 y.o. Education: 13 years  Clinical Impressions  Amanda Solis is a 69 y.o., right-hand dominant, married woman with a history of L4/L5 stenosis resulting in significant pain, as well as anxiety, insomnia, arthritis, and memory problems over the past year and a half. She has a strong family history of dementia with her mother and 2/8 children in her family developing the condition. She has an MRI that is not clearly abnormal and shows age related volume loss and minimal leukoaraiosis.   On neuropsychological testing, Amanda Solis's performance is viewed as a conservative estimate of actual abilities, as she was quite anxious and upset on the day of testing. Nevertheless, her performance validity measures were good. The profile shows a likely memory storage problem affecting acquisition and retention of visual and verbal information. All other scores were within normal limits including on measures of attention, processing speed, visuospatial/constructional, and executive functioning. She has no naming or semantic fluency deficits. I do think that this is a real change for her and is not just attributable to her affective distress during testing. She screened at a mild level for anxiety symptoms and at a subclinical level for depressive symptoms. She has some agitation/anxiety, which seems mainly as a reaction to her cognitive problems and impending back surgery. Her husband rated her as functioning somewhere between an MCI and very mild dementia level on severity status staging.   Amanda Solis is demonstrating a mild level of cognitive impairment mainly involving memory loss and amnestic MCI presents as the best diagnosis at this point in time. She is not demented but may be at risk for decline in the future and would do well to follow regularly and  implement strategies designed to maximize cognitive longevity.   Diagnostic Impressions: Amnestic mild cognitive impairment  Adjustment disorder with anxiety and depressed mood  Recommendations to be discussed with patient  Your performance and presentation on neuropsychological assessment were consistent with difficulties acquiring and maintaining information across time, which appear to be consistent with what is called a developing memory storage problem. Memory storage problems involve not only difficulties learning new material but difficulty retaining it over time and often show up with symptoms such as repeating oneself, forgetting events, and difficulties with other memory dependent activities such as scheduling appointments and managing medications. You are still functioning well and thus, I think that Amnestic Mild Cognitive Impairment is the best diagnosis at this point in time.   The major difference between mild cognitive impairment (MCI) and dementia is in severity and potential prognosis. Once someone reaches a level of severity adequate to be diagnosed with a dementia, there is usually progression over time, though this may be years. On the other hand, mild cognitive impairment, while a significant risk for dementia in future, does not always progress to dementia, and in some instances stays the same or can even revert to normal. It is important to realize that if MCI is due to underlying Alzheimer's disease, it will most likely progress to dementia eventually. The rate of conversion to Alzheimer's dementia from amnestic MCI is about 15% per year versus the general population risk of conversion of 2% per year.   If your MCI is due to an underlying condition like Alzheimer's disease, then it will worsen over time. I am concerned that may be the case but we cannot know for sure. On  the bright side, if you do have a progressive condition, you have caught it early and we can now implement  strategies designed to minimize and/or delay decline in the future.   You are already on a good front-line medication (Aricept); follow closely with Dr. Jaynee Eagles and advise her of your progress.   You can also try dietary and lifestyle changes.   There is now good quality evidence from at least one large scale study that a modified mediterranean diet may help slow cognitive decline. This is known as the "MIND" diet. The Mind diet is not so much a specific diet as it is a set of recommendations for things that you should and should not eat.   Foods that are ENCOURAGED on the MIND Diet:  Green, leafy vegetables: Aim for six or more servings per week. This includes kale, spinach, cooked greens and salads.  All other vegetables: Try to eat another vegetable in addition to the green leafy vegetables at least once a day. It is best to choose non-starchy vegetables because they have a lot of nutrients with a low number of calories.  Berries: Eat berries at least twice a week. There is a plethora of research on strawberries, and other berries such as blueberries, raspberries and blackberries have also been found to have antioxidant and brain health benefits.  Nuts: Try to get five servings of nuts or more each week. The creators of the [place] don't specify what kind of nuts to consume, but it is probably best to vary the type of nuts you eat to obtain a variety of nutrients. Peanuts are a legume and do not fall into this category.  Olive oil: Use olive oil as your main cooking oil. There may be other heart-healthy alternatives such as algae oil, though there is not yet sufficient research upon which to base a formal recommendation.  Whole grains: Aim for at least three servings daily. Choose minimally processed grains like oatmeal, quinoa, brown rice, whole-wheat pasta and 100% whole-wheat bread.  Fish: Eat fish at least once a week. It is best to choose fatty fish like salmon, sardines, trout, tuna and  mackerel for their high amounts of omega-3 fatty acids.  Beans: Include beans in at least four meals every week. This includes all beans, lentils and soybeans.  Poultry: Try to eat chicken or [place] at least twice a week. Note that fried chicken is not encouraged on the MIND diet.  Wine: Aim for no more than one glass of alcohol daily. Both red and white wine may benefit the brain. However, much research has focused on the red wine compound resveratrol, which may help protect against Alzheimer's disease.  Foods that are DISCOURAGED on the MIND Diet: Butter and margarine: Try to eat less than 1 tablespoon (about 14 grams) daily. Instead, try using olive oil as your primary cooking fat, and dipping your bread in olive oil with herbs.  Cheese: The MIND diet recommends limiting your cheese consumption to less than once per week.  Red meat: Aim for no more than three servings each week. This includes all beef, pork, lamb and products made from these meats.  [place] food: The MIND diet highly discourages fried food, especially the kind from fast-food restaurants. Limit your consumption to less than once per week.  Pastries and sweets: This includes most of the processed junk food and desserts you can think of. Ice cream, cookies, brownies, snack cakes, donuts, candy and more. Try to limit these to no  more than four times a week.  Exercise is one of the best medicines for promoting health and maintaining cognitive fitness at all stages in life. Exercise probably has the largest documented effect on brain health and performance of any intervention. Studies have shown that even previously sedentary individuals who start exercising as late as age 14 show a significant survival benefit as compared to their non-exercising peers. In the [place], the current guidelines are for 30 minutes of moderate exercise per day, but increasing your activity level less than that may also be helpful. You do not have to get your  30 minutes of exercise in one shot and exercising for short periods of time spread throughout the day can be helpful. Go for several walks, learn to dance, or do something else you enjoy that gets your body moving. Of course, if you have an underlying medical condition or there is any question about whether it is safe for you to exercise, you should consult a medical treatment provider prior to beginning exercise.   I have no concerns about your ability to drive at this time. Most individuals with MCI are able to drive and I think it is fine, although I would caution you to stay to familiar areas and/or make sure you have GPS to avoid any problems.   You presented as quite anxious during your visit and it sounds like that was a bad day for you but you do get anxious and moody. Effective treatments for that type of issue include antidepressant medication and/or psychotherapy. You can discuss starting an antidepressant if desired with Dr. Lucia Gaskins or your PCP. We can discuss a referral for counseling.  Return for repeat testing in 1 to 2 years so that we can monitor your progress over time.   Test Findings  Test scores are summarized in additional documentation associated with this encounter. Test scores are relative to age, gender, and educational history as available and appropriate. There were no concerns about performance validity as all findings fell within normal expectations. This is with the caveat that Amanda Solis was quite anxious at times during the clinical encounter and testing, which could have pulled some of her scores down but is unlikely to completely account for her low memory performance.   General Intellectual Functioning/Achievement:  Performance was toward the upper aspect of the high average range on single-word reading, with average to high average presenting as reasonable standards of comparison for her cognitive test data.   Attention and Processing Efficiency: Performance on  indicators of attention and working memory was average. Timed number-symbol coding was also average. Simple numeric sequencing was high average (though that is a very easy task).   Language: Language findings were within normal limits for Amanda Solis who scored in the low average range for phonemic fluency and semantic fluency. Visual object confrontation naming was normal.   Visuospatial Function: Performance fell at a reasonable average level within this domain. She achieved a very high score on figure copy, which was errorless, but then judgment of angular line orientations was unusually low. This may reflect anxiety about the task or a rushed approach as opposed to a frank visuospatial problem per se.   Learning and Memory: Performance on learning and memory measures was suggestive of memory storage problems with minimal acquisition of information and virtually no retention across time. Immediate recall for a 12-item word list and brief daily-living type information were unusually low followed by no information on free delayed recall and extremely  low scores. She had more false positives than correct identifications on recognition testing for words from the list. Recognition of the daily-living information was also extremely low. Her learning for a short story fell at an extremely low level, with extremely low delayed free recall (did not recall any words).   In the visual realm, Amanda Solis's delayed free recall of a modestly complex figure was extremely low (did not recall any details).   Executive Functions: Performance was well preserved on executive measures with a very good, high average score on the Executive Function Composite from the [employer] test. Clock drawing was WNL with only minor errors in number placement. She scored at a low average level when reasoning with verbal information on the Complex Ideational Material. Generation of words in response to  letters prompts was low average. Alternating sequencing of numbers and letters of the alphabet was strong and fell in the high average range.   Rating Scale(s): Amanda Solis scored in the mild yet clinically significant range for anxiety symptoms. She scored in the normal range for depressive symptoms. Her husband rated her as functioning anywhere from an  MCI to very mild dementia level, which seems reasonable given her presentation and performance (and giving her the benefit of the doubt and their report on interview, MCI seems more accurate).   Amanda Solis Amanda Reno PsyD, ABN Clinical Neuropsychologist

## 2019-09-10 NOTE — Progress Notes (Signed)
Noted. I am happy to see them for follow up. Rizwan, keep me posted on any potential research opportunities. TY.

## 2019-09-11 ENCOUNTER — Ambulatory Visit: Payer: PPO | Admitting: Neurology

## 2019-09-19 DIAGNOSIS — M48062 Spinal stenosis, lumbar region with neurogenic claudication: Secondary | ICD-10-CM | POA: Diagnosis not present

## 2019-10-01 ENCOUNTER — Ambulatory Visit: Payer: PPO | Admitting: Family Medicine

## 2019-10-01 ENCOUNTER — Encounter: Payer: Self-pay | Admitting: Family Medicine

## 2019-10-01 VITALS — BP 119/75 | HR 65 | Ht 61.0 in | Wt 126.0 lb

## 2019-10-01 DIAGNOSIS — F419 Anxiety disorder, unspecified: Secondary | ICD-10-CM

## 2019-10-01 DIAGNOSIS — G3184 Mild cognitive impairment, so stated: Secondary | ICD-10-CM | POA: Diagnosis not present

## 2019-10-01 MED ORDER — ESCITALOPRAM OXALATE 5 MG PO TABS: 5.0000 mg | ORAL_TABLET | Freq: Every day | ORAL | 3 refills | Status: DC

## 2019-10-01 MED ORDER — DONEPEZIL HCL 5 MG PO TABS: 5.0000 mg | ORAL_TABLET | Freq: Every day | ORAL | 3 refills | Status: DC

## 2019-10-01 NOTE — Progress Notes (Addendum)
PATIENT: Amanda Solis DOB: [birth year]  REASON FOR VISIT: follow up HISTORY FROM: patient  Chief Complaint  Patient presents with  . Follow-up    rm 1 here fpr a f/u on memory. Pt is here with her husband     HISTORY OF PRESENT ILLNESS: Today 10/01/19 Amanda Solis is a 69 y.o. female here today for follow up for memory loss. She continues Aricept. She is not sure it has helped much. She is tolerating well. Neurocognitive testing showed MCI. She does have a strong family history of dementia. She is able to perform ADL's independently. She drives without difficulty. She is able to manage finances. She does endorse anxiety. She has chronic back pain. She is considering surgery but is worried about if it is the right thing for her. She is seeking a second opinion. She walks with her daughter, regularly. She volunteers at [employer] every Monday. She plays games on her phone often.   HISTORY: (copied from [name] note on 05/14/2019)  HPI:  Amanda Solis is a 69 y.o. female here as requested by Amanda Lund, PA for memory changes with a strong family history of Alzheimer's disease.  She has a past medical history of thyroid disease/hypothyroidism, hyperlipidemia, headache, chronic low back pain and spinal stenosis ([name]), anxiety, insomnia, arthritis, anxiety, insomnia, family history of Alzheimer's disease.  I reviewed Amanda Solis notes, patient complaining of recent decline in memory, her thyroid had continued to remain low despite reductions in dosing of levothyroxine and she was referred to endocrinologist, due to reduced dose medication and her TSH remaining high and concerns with memory decline she was sent to her hormonal specialist in [place] who has been prescribing her multiple hormones and treatments over the last 6 months, also managing her thyroid levels,, unfortunately the patient has not seen any improvement in fatigue or memory decline.   Per notes CBC, CMP, TSH, vitamin D, vitamin B12 and lipid panel all were essentially normal (TSH 0.88) labs drawn March 05, 2019.  I did review these labs and agree that they were essentially normal except for vitamin B12 at 219 which can still signify B12 deficiency and we will have to retest that along with methylmalonic acid.  On review of multiple other notes, patient complaining of memory declined, often forgetting things, short-term and longer term, she misplaces items, worried about Alzheimer's given her family history, she also has a history of anxiety on Lexapro, hot flashes has resolved, insomnia, she also has chronic pain severe stenosis L4-L5 and receiving injections with [name] and declined surgery.  I also reviewed several years of epic notes for more history and memory loss which I did not find.  Also did not find anything in care everywhere.  No imaging of the brain as far as I can see.  Here with her husband who also provides information. Started a year ago slowly with forgetting things, repeating things, they saw a metabolic specialist in [place] and they were referred here. She loses her phone quite often. No problems driving, she was lost once and is not driving as much, she does all the accounting of the bills and not missing bills, she checks everything thoroughly and keeps tracks of credit cards, keeps a spreadsheet very detailed, not missing things, she manages her own medication. She feels worse since Covid, more isolated, she is worried about Covid, she has chronic low back pain, she is worried about anesthesia and will not get  surgery on her back, her sister Amanda Solis is oldest sister close to 69 in [place] and within the last few years but unclear if diagnosed with dementia, her brother had dementia in a nursing home unclear, mother had dementia totally didn;t know people or talk but unknown type of dementia. SHe maybe feels her knee surgery 2 years ago may have started her  symptoms, now she has chronic pain. Long-term is fine, short-term is more affected. Denies depression, she has some anxiety. She appears to have mood changes, personality changes, more emotional, no hallunications or delusions.   Reviewed notes, labs and imaging from outside physicians, which showed: see above   REVIEW OF SYSTEMS: Out of a complete 14 system review of symptoms, the patient complains only of the following symptoms, memory loss, anxiety, chronic pain, and all other reviewed systems are negative.  ALLERGIES: No Known Allergies  HOME MEDICATIONS: Outpatient Medications Prior to Visit  Medication Sig Dispense Refill  . Ascorbic Acid (VITAMIN C PO) Take 500 mg by mouth daily.    . Cholecalciferol (VITAMIN D3 PO) Take 50 mcg by mouth daily.    . Cyanocobalamin (VITAMIN B-12 PO) Take 2,500 mcg by mouth daily.    Marland Kitchen levothyroxine (SYNTHROID) 75 MCG tablet Take 75 mcg by mouth daily.    . simvastatin (ZOCOR) 20 MG tablet TAKE 1 TABLET BY MOUTH DAILY AT 6 PM. PATIENT NEEDS OFFICE VISIT FOR FURTHER REFILLS 15 tablet 0  . donepezil (ARICEPT) 5 MG tablet Take 1 tablet (5 mg total) by mouth at bedtime. 30 tablet 6   No facility-administered medications prior to visit.    PAST MEDICAL HISTORY: Past Medical History:  Diagnosis Date  . Anxiety   . Arthritis   . Diverticulitis    in the past  . Headache   . Hyperlipidemia   . Hypothyroidism   . Thyroid disease     PAST SURGICAL HISTORY: Past Surgical History:  Procedure Laterality Date  . APPENDECTOMY    . CHOLECYSTECTOMY    . TOTAL KNEE ARTHROPLASTY Right 09/15/2017   Procedure: RIGHT TOTAL KNEE ARTHROPLASTY;  Surgeon: Beverely Low, MD;  Location: [facility] OR;  Service: Orthopedics;  Laterality: Right;  . TUBAL LIGATION    . VARICOSE VEIN SURGERY      FAMILY HISTORY: Family History  Problem Relation Age of Onset  . Diabetes Mother   . Alzheimer's disease Mother   . Prostate cancer Father   . Alcohol abuse Father   .  Diabetes Father   . Breast cancer Sister   . Alzheimer's disease Sibling   . Alzheimer's disease Sibling   . Colon cancer Neg Hx     SOCIAL HISTORY: Social History   Socioeconomic History  . Marital status: Married    Spouse name: Not on file  . Number of children: 3  . Years of education: 79  . Highest education level: Not on file  Occupational History  . Not on file  Tobacco Use  . Smoking status: Former Smoker    Packs/day: 0.25    Years: 2.00    Pack years: 0.50    Types: Cigarettes    Quit date: 1978    Years since quitting: 43.4  . Smokeless tobacco: Never Used  Substance and Sexual Activity  . Alcohol use: Yes    Alcohol/week: 1.0 standard drinks    Types: 1 Glasses of wine per week  . Drug use: Never  . Sexual activity: Yes    Birth control/protection: Surgical  Other Topics  Concern  . Not on file  Social History Narrative   Lives at home with spouse   Right handed   Caffeine: 2 cups coffee/day   Social Determinants of Health   Financial Resource Strain:   . Difficulty of Paying Living Expenses:   Food Insecurity:   . Worried About Programme researcher, broadcasting/film/video in the Last Year:   . Barista in the Last Year:   Transportation Needs:   . Freight forwarder (Medical):   Marland Kitchen Lack of Transportation (Non-Medical):   Physical Activity:   . Days of Exercise per Week:   . Minutes of Exercise per Session:   Stress:   . Feeling of Stress :   Social Connections:   . Frequency of Communication with Friends and Family:   . Frequency of Social Gatherings with Friends and Family:   . Attends Religious Services:   . Active Member of Clubs or Organizations:   . Attends Banker Meetings:   Marland Kitchen Marital Status:   Intimate Partner Violence:   . Fear of Current or Ex-Partner:   . Emotionally Abused:   Marland Kitchen Physically Abused:   . Sexually Abused:       PHYSICAL EXAM  Vitals:   10/01/19 1007  BP: 119/75  Pulse: 65  Weight: 126 lb (57.2 kg)   Height: 5\' 1"  (1.549 m)   Body mass index is 23.81 kg/m.  Generalized: Well developed, in no acute distress  Cardiology: normal rate and rhythm, no murmur noted Respiratory: clear to auscultation bilaterally  Neurological examination  Mentation: Alert oriented to time, place, history taking. Follows all commands speech and language fluent Cranial nerve II-XII: Pupils were equal round reactive to light. Extraocular movements were full, visual field were full on confrontational test. Facial sensation and strength were normal. Uvula tongue midline. Head turning and shoulder shrug  were normal and symmetric. Motor: The motor testing reveals 5 over 5 strength of all 4 extremities. Good symmetric motor tone is noted throughout.  Sensory: Sensory testing is intact to soft touch on all 4 extremities. No evidence of extinction is noted.  Coordination: Cerebellar testing reveals good finger-nose-finger and heel-to-shin bilaterally.  Gait and station: Gait is normal.   DIAGNOSTIC DATA (LABS, IMAGING, TESTING) - I reviewed patient records, labs, notes, testing and imaging myself where available.  MMSE - Mini Mental State Exam 10/01/2019 05/14/2019 09/07/2016  Orientation to time 3 4 5   Orientation to Place 4 5 5   Registration 3 3 3   Attention/ Calculation 5 5 5   Recall 3 2 2   Language- name 2 objects 2 2 2   Language- repeat 1 1 1   Language- follow 3 step command 3 3 3   Language- read & follow direction 1 1 1   Write a sentence 1 1 1   Copy design 1 0 1  Total score 27 27 29      Lab Results  Component Value Date   WBC 8.4 09/18/2017   HGB 9.5 (L) 09/18/2017   HCT 29.3 (L) 09/18/2017   MCV 90.2 09/18/2017   PLT 195 09/18/2017      Component Value Date/Time   NA 142 05/14/2019 0953   K 4.4 05/14/2019 0953   CL 104 05/14/2019 0953   CL 103 09/16/2015 0000   CO2 24 05/14/2019 0953   GLUCOSE 94 05/14/2019 0953   GLUCOSE 114 (H) 09/16/2017 0410   BUN 18 05/14/2019 0953   CREATININE 0.79  05/14/2019 0953   CALCIUM 9.5 05/14/2019 0953  CALCIUM 9.6 09/16/2015 0000   PROT 7.7 10/20/2016 0923   ALBUMIN 5.0 (H) 10/20/2016 0923   AST 20 10/20/2016 0923   ALT 12 10/20/2016 0923   ALKPHOS 57 10/20/2016 0923   BILITOT 0.4 10/20/2016 0923   GFRNONAA 78 05/14/2019 0953   GFRNONAA 65 09/16/2015 0000   GFRAA 90 05/14/2019 0953   Lab Results  Component Value Date   CHOL 275 (H) 10/20/2016   HDL 111 10/20/2016   LDLCALC 147 (H) 10/20/2016   TRIG 85 10/20/2016   CHOLHDL 2.5 10/20/2016   Lab Results  Component Value Date   HGBA1C 5.6 10/20/2016   Lab Results  Component Value Date   VITAMINB12 1,366 (H) 05/14/2019   Lab Results  Component Value Date   TSH 0.621 01/24/2017       ASSESSMENT AND PLAN 69 y.o. year old female  has a past medical history of Anxiety, Arthritis, Diverticulitis, Headache, Hyperlipidemia, Hypothyroidism, and Thyroid disease. here with     ICD-10-CM   1. Amnestic MCI (mild cognitive impairment with memory loss)  G31.84 donepezil (ARICEPT) 5 MG tablet  2. Anxiety  F41.9 escitalopram (LEXAPRO) 5 MG tablet    Taunja feels that memory is fairly stable today.  Neurocognitive testing did indicate concerns for amnestic mild cognitive impairment.  We have discussed multiple factors that could be contributing.  She does endorse significant anxiety.  We will continue Aricept 5 mg daily.  I will also add Escitalopram 5 mg daily to see if this may help with anxiety management.  I have discussed with her the need to for primary care assistance should we need to change anxiety treatment plan.  We have discussed potential side effects of this medication.  I have provided additional information in her AVS.  She was encouraged to stay engaged with her community.  She was encouraged to stay physically and mentally active.  We have discussed considering the mind diet.  We have also discussed considering research options if they are interested.  She will continue close  follow-up with primary care.  She will seek out a second opinion regarding chronic back pain and need for surgery.  She will see me in 6 months, sooner if needed.  She verbalizes understanding and agreement with this plan.   No orders of the defined types were placed in this encounter.    Meds ordered this encounter  Medications  . escitalopram (LEXAPRO) 5 MG tablet    Sig: Take 1 tablet (5 mg total) by mouth daily.    Dispense:  90 tablet    Refill:  3    Order Specific Question:   Supervising Provider    Answer:   Melvenia Beam [id number]  . donepezil (ARICEPT) 5 MG tablet    Sig: Take 1 tablet (5 mg total) by mouth at bedtime.    Dispense:  90 tablet    Refill:  3    Order Specific Question:   Supervising Provider    Answer:   Melvenia Beam [id number]      I spent 15 minutes with the patient. 50% of this time was spent counseling and educating patient on plan of care and medications.    Debbora Presto, FNP-C 10/01/2019, 10:55 AM [facility] [address] [place], [place] [address] [phone]  Made any corrections needed, and agree with history, physical, neuro exam,assessment and plan as stated.     Sarina Ill, MD [facility]

## 2019-10-01 NOTE — Patient Instructions (Signed)
We will continue donepezil 5mg  daily. We will add escitalopram 5mg  daily. You can take both at night.   Stay active. I recommend a low carb diet. Consider researching the "MIND" diet. Consider participation in research opportunities if you would like.   Follow up in 6 months  Memory Compensation Strategies  1. Use "WARM" strategy.  W= write it down  A= associate it  R= repeat it  M= make a mental note  2.   You can keep a .  Use a 3-ring notebook with sections for the following: calendar, important names and phone numbers,  medications, doctors' names/phone numbers, lists/reminders, and a section to journal what you did  each day.   3.    Use a calendar to write appointments down.  4.    Write yourself a schedule for the day.  This can be placed on the calendar or in a separate section of the Memory Notebook.  Keeping a  regular schedule can help memory.  5.    Use medication organizer with sections for each day or morning/evening pills.  You may need help loading it  6.    Keep a basket, or pegboard by the door.  Place items that you need to take out with you in the basket or on the pegboard.  You may also want to  include a message board for reminders.  7.    Use sticky notes.  Place sticky notes with reminders in a place where the task is performed.  For example: " turn off the  stove" placed by the stove, "lock the door" placed on the door at eye level, " take your medications" on  the bathroom mirror or by the place where you normally take your medications.  8.    Use alarms/timers.  Use while cooking to remind yourself to check on food or as a reminder to take your medicine, or as a  reminder to make a call, or as a reminder to perform another task, etc.    Escitalopram tablets What is this medicine? ESCITALOPRAM (es sye TAL oh pram) is used to treat depression and certain types of anxiety. This medicine may be used for other purposes; ask your health  care provider or pharmacist if you have questions. COMMON BRAND NAME(S): Lexapro What should I tell my health care provider before I take this medicine? They need to know if you have any of these conditions:  bipolar disorder or a family history of bipolar disorder  diabetes  glaucoma  heart disease  kidney or liver disease  receiving electroconvulsive therapy  seizures (convulsions)  suicidal thoughts, plans, or attempt by you or a family member  an unusual or allergic reaction to escitalopram, the related drug citalopram, other medicines, foods, dyes, or preservatives  pregnant or trying to become pregnant  breast-feeding How should I use this medicine? Take this medicine by mouth with a glass of water. Follow the directions on the prescription label. You can take it with or without food. If it upsets your stomach, take it with food. Take your medicine at regular intervals. Do not take it more often than directed. Do not stop taking this medicine suddenly except upon the advice of your doctor. Stopping this medicine too quickly may cause serious side effects or your condition may worsen. A special MedGuide will be given to you by the pharmacist with each prescription and refill. Be sure to read this information carefully each time. Talk to your pediatrician regarding  the use of this medicine in children. Special care may be needed. Overdosage: If you think you have taken too much of this medicine contact a poison control center or emergency room at once. NOTE: This medicine is only for you. Do not share this medicine with others. What if I miss a dose? If you miss a dose, take it as soon as you can. If it is almost time for your next dose, take only that dose. Do not take double or extra doses. What may interact with this medicine? Do not take this medicine with any of the following medications:  certain medicines for fungal infections like fluconazole, itraconazole,  ketoconazole, posaconazole, voriconazole  cisapride  citalopram  dronedarone  linezolid  MAOIs like Carbex, Eldepryl, Marplan, Nardil, and Parnate  methylene blue (injected into a vein)  pimozide  thioridazine This medicine may also interact with the following medications:  alcohol  amphetamines  aspirin and aspirin-like medicines  carbamazepine  certain medicines for depression, anxiety, or psychotic disturbances  certain medicines for migraine headache like almotriptan, eletriptan, frovatriptan, naratriptan, rizatriptan, sumatriptan, zolmitriptan  certain medicines for sleep  certain medicines that treat or prevent blood clots like warfarin, enoxaparin, dalteparin  cimetidine  diuretics  dofetilide  fentanyl  furazolidone  isoniazid  lithium  metoprolol  NSAIDs, medicines for pain and inflammation, like ibuprofen or naproxen  other medicines that prolong the QT interval (cause an abnormal heart rhythm)  procarbazine  rasagiline  supplements like St. John's wort, kava kava, valerian  tramadol  tryptophan  ziprasidone This list may not describe all possible interactions. Give your health care provider a list of all the medicines, herbs, non-prescription drugs, or dietary supplements you use. Also tell them if you smoke, drink alcohol, or use illegal drugs. Some items may interact with your medicine. What should I watch for while using this medicine? Tell your doctor if your symptoms do not get better or if they get worse. Visit your doctor or health care professional for regular checks on your progress. Because it may take several weeks to see the full effects of this medicine, it is important to continue your treatment as prescribed by your doctor. Patients and their families should watch out for new or worsening thoughts of suicide or depression. Also watch out for sudden changes in feelings such as feeling anxious, agitated, panicky, irritable,  hostile, aggressive, impulsive, severely restless, overly excited and hyperactive, or not being able to sleep. If this happens, especially at the beginning of treatment or after a change in dose, call your health care professional. Dennis Bast may get drowsy or dizzy. Do not drive, use machinery, or do anything that needs mental alertness until you know how this medicine affects you. Do not stand or sit up quickly, especially if you are an older patient. This reduces the risk of dizzy or fainting spells. Alcohol may interfere with the effect of this medicine. Avoid alcoholic drinks. Your mouth may get dry. Chewing sugarless gum or sucking hard candy, and drinking plenty of water may help. Contact your doctor if the problem does not go away or is severe. What side effects may I notice from receiving this medicine? Side effects that you should report to your doctor or health care professional as soon as possible:  allergic reactions like skin rash, itching or hives, swelling of the face, lips, or tongue  anxious  black, tarry stools  changes in vision  confusion  elevated mood, decreased need for sleep, racing thoughts, impulsive behavior  eye pain  fast, irregular heartbeat  feeling faint or lightheaded, falls  feeling agitated, angry, or irritable  hallucination, loss of contact with reality  loss of balance or coordination  loss of memory  painful or prolonged erections  restlessness, pacing, inability to keep still  seizures  stiff muscles  suicidal thoughts or other mood changes  trouble sleeping  unusual bleeding or bruising  unusually weak or tired  vomiting Side effects that usually do not require medical attention (report to your doctor or health care professional if they continue or are bothersome):  changes in appetite  change in sex drive or performance  headache  increased sweating  indigestion, nausea  tremors This list may not describe all possible  side effects. Call your doctor for medical advice about side effects. You may report side effects to FDA at 1-800-FDA-1088. Where should I keep my medicine? Keep out of reach of children. Store at room temperature between 15 and 30 degrees C (59 and 86 degrees F). Throw away any unused medicine after the expiration date. NOTE: This sheet is a summary. It may not cover all possible information. If you have questions about this medicine, talk to your doctor, pharmacist, or health care provider.  2020 Elsevier/Gold Standard (2018-04-02 11:21:44)   Donepezil tablets What is this medicine? DONEPEZIL (doe NEP e zil) is used to treat mild to moderate dementia caused by Alzheimer's disease. This medicine may be used for other purposes; ask your health care provider or pharmacist if you have questions. COMMON BRAND NAME(S): Aricept What should I tell my health care provider before I take this medicine? They need to know if you have any of these conditions:  asthma or other lung disease  difficulty passing urine  head injury  heart disease  history of irregular heartbeat  liver disease  seizures (convulsions)  stomach or intestinal disease, ulcers or stomach bleeding  an unusual or allergic reaction to donepezil, other medicines, foods, dyes, or preservatives  pregnant or trying to get pregnant  breast-feeding How should I use this medicine? Take this medicine by mouth with a glass of water. Follow the directions on the prescription label. You may take this medicine with or without food. Take this medicine at regular intervals. This medicine is usually taken before bedtime. Do not take it more often than directed. Continue to take your medicine even if you feel better. Do not stop taking except on your doctor's advice. If you are taking the 23 mg donepezil tablet, swallow it whole; do not cut, crush, or chew it. Talk to your pediatrician regarding the use of this medicine in children.  Special care may be needed. Overdosage: If you think you have taken too much of this medicine contact a poison control center or emergency room at once. NOTE: This medicine is only for you. Do not share this medicine with others. What if I miss a dose? If you miss a dose, take it as soon as you can. If it is almost time for your next dose, take only that dose, do not take double or extra doses. What may interact with this medicine? Do not take this medicine with any of the following medications:  certain medicines for fungal infections like itraconazole, fluconazole, posaconazole, and voriconazole  cisapride  dextromethorphan; quinidine  dronedarone  pimozide  quinidine  thioridazine This medicine may also interact with the following medications:  antihistamines for allergy, cough and cold  atropine  bethanechol  carbamazepine  certain medicines for bladder  problems like oxybutynin, tolterodine  certain medicines for Parkinson's disease like benztropine, trihexyphenidyl  certain medicines for stomach problems like dicyclomine, hyoscyamine  certain medicines for travel sickness like scopolamine  dexamethasone  dofetilide  ipratropium  NSAIDs, medicines for pain and inflammation, like ibuprofen or naproxen  other medicines for Alzheimer's disease  other medicines that prolong the QT interval (cause an abnormal heart rhythm)  phenobarbital  phenytoin  rifampin, rifabutin or rifapentine  ziprasidone This list may not describe all possible interactions. Give your health care provider a list of all the medicines, herbs, non-prescription drugs, or dietary supplements you use. Also tell them if you smoke, drink alcohol, or use illegal drugs. Some items may interact with your medicine. What should I watch for while using this medicine? Visit your doctor or health care professional for regular checks on your progress. Check with your doctor or health care  professional if your symptoms do not get better or if they get worse. You may get drowsy or dizzy. Do not drive, use machinery, or do anything that needs mental alertness until you know how this drug affects you. What side effects may I notice from receiving this medicine? Side effects that you should report to your doctor or health care professional as soon as possible:  allergic reactions like skin rash, itching or hives, swelling of the face, lips, or tongue  feeling faint or lightheaded, falls  loss of bladder control  seizures  signs and symptoms of a dangerous change in heartbeat or heart rhythm like chest pain; dizziness; fast or irregular heartbeat; palpitations; feeling faint or lightheaded, falls; breathing problems  signs and symptoms of infection like fever or chills; cough; sore throat; pain or trouble passing urine  signs and symptoms of liver injury like dark yellow or brown urine; general ill feeling or flu-like symptoms; light-colored stools; loss of appetite; nausea; right upper belly pain; unusually weak or tired; yellowing of the eyes or skin  slow heartbeat or palpitations  unusual bleeding or bruising  vomiting Side effects that usually do not require medical attention (report to your doctor or health care professional if they continue or are bothersome):  diarrhea, especially when starting treatment  headache  loss of appetite  muscle cramps  nausea  stomach upset This list may not describe all possible side effects. Call your doctor for medical advice about side effects. You may report side effects to FDA at 1-800-FDA-1088. Where should I keep my medicine? Keep out of reach of children. Store at room temperature between 15 and 30 degrees C (59 and 86 degrees F). Throw away any unused medicine after the expiration date. NOTE: This sheet is a summary. It may not cover all possible information. If you have questions about this medicine, talk to your  doctor, pharmacist, or health care provider.  2020 Elsevier/Gold Standard (2018-04-02 10:33:41)

## 2019-10-18 DIAGNOSIS — E7800 Pure hypercholesterolemia, unspecified: Secondary | ICD-10-CM | POA: Diagnosis not present

## 2019-10-18 DIAGNOSIS — F419 Anxiety disorder, unspecified: Secondary | ICD-10-CM | POA: Diagnosis not present

## 2019-10-18 DIAGNOSIS — R413 Other amnesia: Secondary | ICD-10-CM | POA: Diagnosis not present

## 2019-10-18 DIAGNOSIS — E039 Hypothyroidism, unspecified: Secondary | ICD-10-CM | POA: Diagnosis not present

## 2019-10-22 DIAGNOSIS — M5136 Other intervertebral disc degeneration, lumbar region: Secondary | ICD-10-CM | POA: Diagnosis not present

## 2019-10-22 DIAGNOSIS — M5134 Other intervertebral disc degeneration, thoracic region: Secondary | ICD-10-CM | POA: Diagnosis not present

## 2019-10-22 DIAGNOSIS — M5137 Other intervertebral disc degeneration, lumbosacral region: Secondary | ICD-10-CM | POA: Diagnosis not present

## 2019-10-22 DIAGNOSIS — Z96651 Presence of right artificial knee joint: Secondary | ICD-10-CM | POA: Diagnosis not present

## 2019-10-22 DIAGNOSIS — M4316 Spondylolisthesis, lumbar region: Secondary | ICD-10-CM | POA: Diagnosis not present

## 2019-10-22 DIAGNOSIS — M47816 Spondylosis without myelopathy or radiculopathy, lumbar region: Secondary | ICD-10-CM | POA: Diagnosis not present

## 2019-10-22 DIAGNOSIS — M48061 Spinal stenosis, lumbar region without neurogenic claudication: Secondary | ICD-10-CM | POA: Diagnosis not present

## 2019-10-22 DIAGNOSIS — G8929 Other chronic pain: Secondary | ICD-10-CM | POA: Diagnosis not present

## 2019-11-07 DIAGNOSIS — M48062 Spinal stenosis, lumbar region with neurogenic claudication: Secondary | ICD-10-CM | POA: Diagnosis not present

## 2019-11-13 DIAGNOSIS — G8929 Other chronic pain: Secondary | ICD-10-CM | POA: Diagnosis not present

## 2019-11-13 DIAGNOSIS — M4316 Spondylolisthesis, lumbar region: Secondary | ICD-10-CM | POA: Diagnosis not present

## 2019-11-13 DIAGNOSIS — M549 Dorsalgia, unspecified: Secondary | ICD-10-CM | POA: Diagnosis not present

## 2019-11-13 DIAGNOSIS — M47816 Spondylosis without myelopathy or radiculopathy, lumbar region: Secondary | ICD-10-CM | POA: Diagnosis not present

## 2019-11-13 DIAGNOSIS — M48061 Spinal stenosis, lumbar region without neurogenic claudication: Secondary | ICD-10-CM | POA: Diagnosis not present

## 2019-11-19 DIAGNOSIS — M48062 Spinal stenosis, lumbar region with neurogenic claudication: Secondary | ICD-10-CM | POA: Diagnosis not present

## 2019-11-21 DIAGNOSIS — M48062 Spinal stenosis, lumbar region with neurogenic claudication: Secondary | ICD-10-CM | POA: Diagnosis not present

## 2019-11-26 DIAGNOSIS — M48062 Spinal stenosis, lumbar region with neurogenic claudication: Secondary | ICD-10-CM | POA: Diagnosis not present

## 2019-11-28 DIAGNOSIS — M48062 Spinal stenosis, lumbar region with neurogenic claudication: Secondary | ICD-10-CM | POA: Diagnosis not present

## 2019-12-03 DIAGNOSIS — M48062 Spinal stenosis, lumbar region with neurogenic claudication: Secondary | ICD-10-CM | POA: Diagnosis not present

## 2019-12-04 DIAGNOSIS — E7800 Pure hypercholesterolemia, unspecified: Secondary | ICD-10-CM | POA: Diagnosis not present

## 2019-12-05 DIAGNOSIS — M48062 Spinal stenosis, lumbar region with neurogenic claudication: Secondary | ICD-10-CM | POA: Diagnosis not present

## 2019-12-11 DIAGNOSIS — M48062 Spinal stenosis, lumbar region with neurogenic claudication: Secondary | ICD-10-CM | POA: Diagnosis not present

## 2019-12-12 DIAGNOSIS — M48062 Spinal stenosis, lumbar region with neurogenic claudication: Secondary | ICD-10-CM | POA: Diagnosis not present

## 2019-12-17 DIAGNOSIS — M25561 Pain in right knee: Secondary | ICD-10-CM | POA: Diagnosis not present

## 2019-12-25 DIAGNOSIS — M25561 Pain in right knee: Secondary | ICD-10-CM | POA: Diagnosis not present

## 2019-12-26 DIAGNOSIS — M4316 Spondylolisthesis, lumbar region: Secondary | ICD-10-CM | POA: Diagnosis not present

## 2019-12-26 DIAGNOSIS — M48061 Spinal stenosis, lumbar region without neurogenic claudication: Secondary | ICD-10-CM | POA: Diagnosis not present

## 2019-12-31 DIAGNOSIS — M25561 Pain in right knee: Secondary | ICD-10-CM | POA: Diagnosis not present

## 2020-01-02 DIAGNOSIS — M25561 Pain in right knee: Secondary | ICD-10-CM | POA: Diagnosis not present

## 2020-01-02 DIAGNOSIS — M48062 Spinal stenosis, lumbar region with neurogenic claudication: Secondary | ICD-10-CM | POA: Diagnosis not present

## 2020-01-10 DIAGNOSIS — M25561 Pain in right knee: Secondary | ICD-10-CM | POA: Diagnosis not present

## 2020-01-10 DIAGNOSIS — M48062 Spinal stenosis, lumbar region with neurogenic claudication: Secondary | ICD-10-CM | POA: Diagnosis not present

## 2020-01-17 DIAGNOSIS — M25561 Pain in right knee: Secondary | ICD-10-CM | POA: Diagnosis not present

## 2020-01-17 DIAGNOSIS — M48062 Spinal stenosis, lumbar region with neurogenic claudication: Secondary | ICD-10-CM | POA: Diagnosis not present

## 2020-01-23 DIAGNOSIS — E7800 Pure hypercholesterolemia, unspecified: Secondary | ICD-10-CM | POA: Diagnosis not present

## 2020-01-24 DIAGNOSIS — M48062 Spinal stenosis, lumbar region with neurogenic claudication: Secondary | ICD-10-CM | POA: Diagnosis not present

## 2020-01-24 DIAGNOSIS — M25561 Pain in right knee: Secondary | ICD-10-CM | POA: Diagnosis not present

## 2020-01-29 DIAGNOSIS — M25561 Pain in right knee: Secondary | ICD-10-CM | POA: Diagnosis not present

## 2020-01-29 DIAGNOSIS — M48062 Spinal stenosis, lumbar region with neurogenic claudication: Secondary | ICD-10-CM | POA: Diagnosis not present

## 2020-02-14 DIAGNOSIS — M25561 Pain in right knee: Secondary | ICD-10-CM | POA: Diagnosis not present

## 2020-02-14 DIAGNOSIS — M48062 Spinal stenosis, lumbar region with neurogenic claudication: Secondary | ICD-10-CM | POA: Diagnosis not present

## 2020-02-19 DIAGNOSIS — N1831 Chronic kidney disease, stage 3a: Secondary | ICD-10-CM | POA: Diagnosis not present

## 2020-02-19 DIAGNOSIS — E039 Hypothyroidism, unspecified: Secondary | ICD-10-CM | POA: Diagnosis not present

## 2020-02-19 DIAGNOSIS — M1711 Unilateral primary osteoarthritis, right knee: Secondary | ICD-10-CM | POA: Diagnosis not present

## 2020-02-19 DIAGNOSIS — E7800 Pure hypercholesterolemia, unspecified: Secondary | ICD-10-CM | POA: Diagnosis not present

## 2020-02-21 DIAGNOSIS — M48062 Spinal stenosis, lumbar region with neurogenic claudication: Secondary | ICD-10-CM | POA: Diagnosis not present

## 2020-02-21 DIAGNOSIS — M25561 Pain in right knee: Secondary | ICD-10-CM | POA: Diagnosis not present

## 2020-02-27 DIAGNOSIS — Z1231 Encounter for screening mammogram for malignant neoplasm of breast: Secondary | ICD-10-CM | POA: Diagnosis not present

## 2020-02-27 DIAGNOSIS — M8588 Other specified disorders of bone density and structure, other site: Secondary | ICD-10-CM | POA: Diagnosis not present

## 2020-02-27 DIAGNOSIS — Z6824 Body mass index (BMI) 24.0-24.9, adult: Secondary | ICD-10-CM | POA: Diagnosis not present

## 2020-02-27 DIAGNOSIS — N958 Other specified menopausal and perimenopausal disorders: Secondary | ICD-10-CM | POA: Diagnosis not present

## 2020-02-27 DIAGNOSIS — Z01419 Encounter for gynecological examination (general) (routine) without abnormal findings: Secondary | ICD-10-CM | POA: Diagnosis not present

## 2020-03-30 NOTE — Patient Instructions (Incomplete)
  Below is our plan:  We will ***  Please make sure you are staying well hydrated. I recommend 50-60 ounces daily. Well balanced diet and regular exercise encouraged.    Please continue follow up with care team as directed.   Follow up with *** in ***  You may receive a survey regarding today's visit. I encourage you to leave honest feed back as I do use this information to improve patient care. Thank you for seeing me today!      Memory Compensation Strategies  1. Use "WARM" strategy.  W= write it down  A= associate it  R= repeat it  M= make a mental note  2.   You can keep a Memory Notebook.  Use a 3-ring notebook with sections for the following: calendar, important names and phone numbers,  medications, doctors' names/phone numbers, lists/reminders, and a section to journal what you did  each day.   3.    Use a calendar to write appointments down.  4.    Write yourself a schedule for the day.  This can be placed on the calendar or in a separate section of the Memory Notebook.  Keeping a  regular schedule can help memory.  5.    Use medication organizer with sections for each day or morning/evening pills.  You may need help loading it  6.    Keep a basket, or pegboard by the door.  Place items that you need to take out with you in the basket or on the pegboard.  You may also want to  include a message board for reminders.  7.    Use sticky notes.  Place sticky notes with reminders in a place where the task is performed.  For example: " turn off the  stove" placed by the stove, "lock the door" placed on the door at eye level, " take your medications" on  the bathroom mirror or by the place where you normally take your medications.  8.    Use alarms/timers.  Use while cooking to remind yourself to check on food or as a reminder to take your medicine, or as a  reminder to make a call, or as a reminder to perform another task, etc.  

## 2020-03-30 NOTE — Progress Notes (Deleted)
No chief complaint on file.    HISTORY OF PRESENT ILLNESS: Today 03/30/20  Amanda Solis is a 69 y.o. female here today for follow up for MCI and anxiety. We continues donepezil 5mg  and added escitalopram 5mg  at bedtime in 09/2019.      HISTORY (copied from previous note)    REVIEW OF SYSTEMS: Out of a complete 14 system review of symptoms, the patient complains only of the following symptoms, and all other reviewed systems are negative.   ALLERGIES: No Known Allergies   HOME MEDICATIONS: Outpatient Medications Prior to Visit  Medication Sig Dispense Refill  . Ascorbic Acid (VITAMIN C PO) Take 500 mg by mouth daily.    . Cholecalciferol (VITAMIN D3 PO) Take 50 mcg by mouth daily.    . Cyanocobalamin (VITAMIN B-12 PO) Take 2,500 mcg by mouth daily.    donepezil (ARICEPT) 5 MG tablet Take 1 tablet (5 mg total) by mouth at bedtime. 90 tablet 3  . escitalopram (LEXAPRO) 5 MG tablet Take 1 tablet (5 mg total) by mouth daily. 90 tablet 3  . levothyroxine (SYNTHROID) 75 MCG tablet Take 75 mcg by mouth daily.    . simvastatin (ZOCOR) 20 MG tablet TAKE 1 TABLET BY MOUTH DAILY AT 6 PM. PATIENT NEEDS OFFICE VISIT FOR FURTHER REFILLS 15 tablet 0   No facility-administered medications prior to visit.     PAST MEDICAL HISTORY: Past Medical History:  Diagnosis Date  . Anxiety   . Arthritis   . Diverticulitis    in the past  . Headache   . Hyperlipidemia   . Hypothyroidism   . Thyroid disease      PAST SURGICAL HISTORY: Past Surgical History:  Procedure Laterality Date  . APPENDECTOMY    . CHOLECYSTECTOMY    . TOTAL KNEE ARTHROPLASTY Right 09/15/2017   Procedure: RIGHT TOTAL KNEE ARTHROPLASTY;  Surgeon: Marland Kitchen, MD;  Location[facility] OR;  Service: Orthopedics;  Laterality: Right;  . TUBAL LIGATION    . VARICOSE VEIN SURGERY       FAMILY HISTORY: Family History  Problem Relation Age of Onset  . Diabetes Mother   . Alzheimer's disease Mother   .  Prostate cancer Father   . Alcohol abuse Father   . Diabetes Father   . Breast cancer Sister   . Alzheimer's disease Sibling   . Alzheimer's disease Sibling   . Colon cancer Neg Hx      SOCIAL HISTORY: Social History   Socioeconomic History  . Marital status: Married    Spouse name: Not on file  . Number of children: 3  . Years of education: 70  . Highest education level: Not on file  Occupational History  . Not on file  Tobacco Use  . Smoking status: Former Smoker    Packs/day: 0.25    Years: 2.00    Pack years: 0.50    Types: Cigarettes    Quit date: 1978    Years since quitting: 43.9  . Smokeless tobacco: Never Used  Vaping Use  . Vaping Use: Never used  Substance and Sexual Activity  . Alcohol use: Yes    Alcohol/week: 1.0 standard drink    Types: 1 Glasses of wine per week  . Drug use: Never  . Sexual activity: Yes    Birth control/protection: Surgical  Other Topics Concern  . Not on file  Social History Narrative   Lives at home with spouse   Right handed   Caffeine: 2  cups coffee/day   Social Determinants of Health   Financial Resource Strain:   . Difficulty of Paying Living Expenses: Not on file  Food Insecurity:   . Worried About Programme researcher, broadcasting/film/video in the Last Year: Not on file  . Ran Out of Food in the Last Year: Not on file  Transportation Needs:   . Lack of Transportation (Medical): Not on file  . Lack of Transportation (Non-Medical): Not on file  Physical Activity:   . Days of Exercise per Week: Not on file  . Minutes of Exercise per Session: Not on file  Stress:   . Feeling of Stress : Not on file  Social Connections:   . Frequency of Communication with Friends and Family: Not on file  . Frequency of Social Gatherings with Friends and Family: Not on file  . Attends Religious Services: Not on file  . Active Member of Clubs or Organizations: Not on file  . Attends Banker Meetings: Not on file  . Marital Status: Not on file   Intimate Partner Violence:   . Fear of Current or Ex-Partner: Not on file  . Emotionally Abused: Not on file  . Physically Abused: Not on file  . Sexually Abused: Not on file      PHYSICAL EXAM  There were no vitals filed for this visit. There is no height or weight on file to calculate BMI.   Generalized: Well developed, in no acute distress  Cardiology: normal rate and rhythm, no murmur auscultated  Respiratory: clear to auscultation bilaterally    Neurological examination  Mentation: Alert oriented to time, place, history taking. Follows all commands speech and language fluent Cranial nerve II-XII: Pupils were equal round reactive to light. Extraocular movements were full, visual field were full on confrontational test. Facial sensation and strength were normal. Uvula tongue midline. Head turning and shoulder shrug  were normal and symmetric. Motor: The motor testing reveals 5 over 5 strength of all 4 extremities. Good symmetric motor tone is noted throughout.  Sensory: Sensory testing is intact to soft touch on all 4 extremities. No evidence of extinction is noted.  Coordination: Cerebellar testing reveals good finger-nose-finger and heel-to-shin bilaterally.  Gait and station: Gait is normal. Tandem gait is normal. Romberg is negative. No drift is seen.  Reflexes: Deep tendon reflexes are symmetric and normal bilaterally.     DIAGNOSTIC DATA (LABS, IMAGING, TESTING) - I reviewed patient records, labs, notes, testing and imaging myself where available.  Lab Results  Component Value Date   WBC 8.4 09/18/2017   HGB 9.5 (L) 09/18/2017   HCT 29.3 (L) 09/18/2017   MCV 90.2 09/18/2017   PLT 195 09/18/2017      Component Value Date/Time   NA 142 05/14/2019 0953   K 4.4 05/14/2019 0953   CL 104 05/14/2019 0953   CL 103 09/16/2015 0000   CO2 24 05/14/2019 0953   GLUCOSE 94 05/14/2019 0953   GLUCOSE 114 (H) 09/16/2017 0410   BUN 18 05/14/2019 0953   CREATININE 0.79  05/14/2019 0953   CALCIUM 9.5 05/14/2019 0953   CALCIUM 9.6 09/16/2015 0000   PROT 7.7 10/20/2016 0923   ALBUMIN 5.0 (H) 10/20/2016 0923   AST 20 10/20/2016 0923   ALT 12 10/20/2016 0923   ALKPHOS 57 10/20/2016 0923   BILITOT 0.4 10/20/2016 0923   GFRNONAA 78 05/14/2019 0953   GFRNONAA 65 09/16/2015 0000   GFRAA 90 05/14/2019 0953   Lab Results  Component Value Date  CHOL 275 (H) 10/20/2016   HDL 111 10/20/2016   LDLCALC 147 (H) 10/20/2016   TRIG 85 10/20/2016   CHOLHDL 2.5 10/20/2016   Lab Results  Component Value Date   HGBA1C 5.6 10/20/2016   Lab Results  Component Value Date   VITAMINB12 1,366 (H) 05/14/2019   Lab Results  Component Value Date   TSH 0.621 01/24/2017      ASSESSMENT AND PLAN  69 y.o. year old female  has a past medical history of Anxiety, Arthritis, Diverticulitis, Headache, Hyperlipidemia, Hypothyroidism, and Thyroid disease. here with ***  Amnestic MCI (mild cognitive impairment with memory loss)  Anxiety   I spent 20 minutes of face-to-face and non-face-to-face time with patient.  This included previsit chart review, lab review, study review, order entry, electronic health record documentation, patient education.    Shawnie Dapper, MSN, FNP-C 03/30/2020, 2:02 PM  [facility] [address], [address] [place], [place] [address] [phone]

## 2020-03-31 ENCOUNTER — Ambulatory Visit: Payer: PPO | Admitting: Family Medicine

## 2020-03-31 DIAGNOSIS — F419 Anxiety disorder, unspecified: Secondary | ICD-10-CM

## 2020-03-31 DIAGNOSIS — G3184 Mild cognitive impairment, so stated: Secondary | ICD-10-CM

## 2020-04-28 DIAGNOSIS — R058 Other specified cough: Secondary | ICD-10-CM | POA: Diagnosis not present

## 2020-04-28 DIAGNOSIS — E7800 Pure hypercholesterolemia, unspecified: Secondary | ICD-10-CM | POA: Diagnosis not present

## 2020-04-28 DIAGNOSIS — J3489 Other specified disorders of nose and nasal sinuses: Secondary | ICD-10-CM | POA: Diagnosis not present

## 2020-04-28 DIAGNOSIS — E039 Hypothyroidism, unspecified: Secondary | ICD-10-CM | POA: Diagnosis not present

## 2020-05-26 ENCOUNTER — Encounter: Payer: Self-pay | Admitting: Family Medicine

## 2020-05-26 ENCOUNTER — Ambulatory Visit: Payer: PPO | Admitting: Family Medicine

## 2020-05-26 VITALS — BP 131/76 | HR 67 | Ht 61.0 in | Wt 134.4 lb

## 2020-05-26 DIAGNOSIS — F418 Other specified anxiety disorders: Secondary | ICD-10-CM

## 2020-05-26 DIAGNOSIS — G3184 Mild cognitive impairment, so stated: Secondary | ICD-10-CM | POA: Diagnosis not present

## 2020-05-26 DIAGNOSIS — Z818 Family history of other mental and behavioral disorders: Secondary | ICD-10-CM | POA: Diagnosis not present

## 2020-05-26 MED ORDER — DONEPEZIL HCL 10 MG PO TABS: 10.0000 mg | ORAL_TABLET | Freq: Every day | ORAL | 3 refills | Status: DC

## 2020-05-26 NOTE — Progress Notes (Addendum)
PATIENT: Amanda Solis DOB: 03/26/1951  REASON FOR VISIT: follow up HISTORY FROM: patient  Chief Complaint  Patient presents with  . Follow-up    Rm 2 with husband (david) Pt states she is good, no big changes      HISTORY OF PRESENT ILLNESS:  05/26/20 ALL: She returns today for follow up for MCI. She continues Aricept 5mg  daily. We added escitalopram 5mg  at last visit due to concerns of anxiety. She is uncertain if she has continued this medication. No obvious side effects that she can remember. She presents with her husband, today, who aids in history. They report that memory is fairly stable. Still having difficulty with short term memory. She continues to perform ADLs independently. Able to assist with managing home, finances and grocery shopping. Driving without difficulty. She walks with her daughter two days a week and goes to water aerobics regularly.    10/01/2019 ALL:  Amanda Solis is a 70 y.o. female here today for follow up for memory loss. She continues Aricept. She is not sure it has helped much. She is tolerating well. Neurocognitive testing showed MCI. She does have a strong family history of dementia. She is able to perform ADL's independently. She drives without difficulty. She is able to manage finances. She does endorse anxiety. She has chronic back pain. She is considering surgery but is worried about if it is the right thing for her. She is seeking a second opinion. She walks with her daughter, regularly. She volunteers at [employer] every Monday. She plays games on her phone often.    HISTORY: (copied from [name] note on 05/14/2019)  HPI:  Amanda Solis is a 70 y.o. female here as requested by [name], PA for memory changes with a strong family history of Alzheimer's disease.  She has a past medical history of thyroid disease/hypothyroidism, hyperlipidemia, headache, chronic low back pain and spinal stenosis ([name]), anxiety,  insomnia, arthritis, anxiety, insomnia, family history of Alzheimer's disease.  I reviewed [name], patient complaining of recent decline in memory, her thyroid had continued to remain low despite reductions in dosing of levothyroxine and she was referred to endocrinologist, due to reduced dose medication and her TSH remaining high and concerns with memory decline she was sent to her hormonal specialist in [place] who has been prescribing her multiple hormones and treatments over the last 6 months, also managing her thyroid levels,, unfortunately the patient has not seen any improvement in fatigue or memory decline.  Per notes CBC, CMP, TSH, vitamin D, vitamin B12 and lipid panel all were essentially normal (TSH 0.88) labs drawn March 05, 2019.  I did review these labs and agree that they were essentially normal except for vitamin B12 at 219 which can still signify B12 deficiency and we will have to retest that along with methylmalonic acid.  On review of multiple other notes, patient complaining of memory declined, often forgetting things, short-term and longer term, she misplaces items, worried about Alzheimer's given her family history, she also has a history of anxiety on Lexapro, hot flashes has resolved, insomnia, she also has chronic pain severe stenosis L4-L5 and receiving injections with [name] and declined surgery.  I also reviewed several years of epic notes for more history and memory loss which I did not find.  Also did not find anything in care everywhere.  No imaging of the brain as far as I can see.  Here with her  husband who also provides information. Started a year ago slowly with forgetting things, repeating things, they saw a metabolic specialist in [place] and they were referred here. She loses her phone quite often. No problems driving, she was lost once and is not driving as much, she does all the accounting of the bills and not missing bills, she checks  everything thoroughly and keeps tracks of credit cards, keeps a spreadsheet very detailed, not missing things, she manages her own medication. She feels worse since Covid, more isolated, she is worried about Covid, she has chronic low back pain, she is worried about anesthesia and will not get surgery on her back, [name] is oldest sister close to 75 in [place] and within the last few years but unclear if diagnosed with dementia, her brother had dementia in a nursing home unclear, mother had dementia totally didn;t know people or talk but unknown type of dementia. SHe maybe feels her knee surgery 2 years ago may have started her symptoms, now she has chronic pain. Long-term is fine, short-term is more affected. Denies depression, she has some anxiety. She appears to have mood changes, personality changes, more emotional, no hallunications or delusions.   Reviewed notes, labs and imaging from outside physicians, which showed: see above   REVIEW OF SYSTEMS: Out of a complete 14 system review of symptoms, the patient complains only of the following symptoms, memory loss, anxiety, chronic pain, and all other reviewed systems are negative.  ALLERGIES: No Known Allergies  HOME MEDICATIONS: Outpatient Medications Prior to Visit  Medication Sig Dispense Refill  . Ascorbic Acid (VITAMIN C PO) Take 500 mg by mouth daily.    . Cholecalciferol (VITAMIN D3 PO) Take 50 mcg by mouth daily.    . Cyanocobalamin (VITAMIN B-12 PO) Take 2,500 mcg by mouth daily.    Marland Kitchen levothyroxine (SYNTHROID) 75 MCG tablet Take 75 mcg by mouth daily.    . simvastatin (ZOCOR) 20 MG tablet TAKE 1 TABLET BY MOUTH DAILY AT 6 PM. PATIENT NEEDS OFFICE VISIT FOR FURTHER REFILLS 15 tablet 0  . donepezil (ARICEPT) 5 MG tablet Take 1 tablet (5 mg total) by mouth at bedtime. 90 tablet 3  . escitalopram (LEXAPRO) 5 MG tablet Take 1 tablet (5 mg total) by mouth daily. 90 tablet 3   No facility-administered medications prior to  visit.    PAST MEDICAL HISTORY: Past Medical History:  Diagnosis Date  . Anxiety   . Arthritis   . Diverticulitis    in the past  . Headache   . Hyperlipidemia   . Hypothyroidism   . Thyroid disease     PAST SURGICAL HISTORY: Past Surgical History:  Procedure Laterality Date  . APPENDECTOMY    . CHOLECYSTECTOMY    . TOTAL KNEE ARTHROPLASTY Right 09/15/2017   Procedure: RIGHT TOTAL KNEE ARTHROPLASTY;  Surgeon: Beverely Low, MD;  Location[facility] OR;  Service: Orthopedics;  Laterality: Right;  . TUBAL LIGATION    . VARICOSE VEIN SURGERY      FAMILY HISTORY: Family History  Problem Relation Age of Onset  . Diabetes Mother   . Alzheimer's disease Mother   . Prostate cancer Father   . Alcohol abuse Father   . Diabetes Father   . Breast cancer Sister   . Alzheimer's disease Sibling   . Alzheimer's disease Sibling   . Colon cancer Neg Hx     SOCIAL HISTORY: Social History   Socioeconomic History  . Marital status: Married    Spouse  name: Not on file  . Number of children: 3  . Years of education: 60  . Highest education level: Not on file  Occupational History  . Not on file  Tobacco Use  . Smoking status: Former Smoker    Packs/day: 0.25    Years: 2.00    Pack years: 0.50    Types: Cigarettes    Quit date: 1978    Years since quitting: 44.1  . Smokeless tobacco: Never Used  Vaping Use  . Vaping Use: Never used  Substance and Sexual Activity  . Alcohol use: Yes    Alcohol/week: 1.0 standard drink    Types: 1 Glasses of wine per week  . Drug use: Never  . Sexual activity: Yes    Birth control/protection: Surgical  Other Topics Concern  . Not on file  Social History Narrative   Lives at home with spouse   Right handed   Caffeine: 2 cups coffee/day   Social Determinants of Health   Financial Resource Strain: Not on file  Food Insecurity: Not on file  Transportation Needs: Not on file  Physical Activity: Not on file  Stress: Not on file  Social  Connections: Not on file  Intimate Partner Violence: Not on file      PHYSICAL EXAM  Vitals:   05/26/20 1048  BP: 131/76  Pulse: 67  Weight: 134 lb 6.4 oz (61 kg)  Height: 5\' 1"  (1.549 m)   Body mass index is 25.39 kg/m.  Generalized: Well developed, in no acute distress  Cardiology: normal rate and rhythm, no murmur noted Respiratory: clear to auscultation bilaterally  Neurological examination  Mentation: Alert oriented to time, place, history taking. Follows all commands speech and language fluent Cranial nerve II-XII: Pupils were equal round reactive to light. Extraocular movements were full, visual field were full on confrontational test. Facial sensation and strength were normal.  Head turning and shoulder shrug  were normal and symmetric. Motor: The motor testing reveals 5 over 5 strength of all 4 extremities. Good symmetric motor tone is noted throughout.  Sensory: Sensory testing is intact to soft touch on all 4 extremities. No evidence of extinction is noted.  Coordination: Cerebellar testing reveals good finger-nose-finger and heel-to-shin bilaterally.  Gait and station: Gait is normal.   DIAGNOSTIC DATA (LABS, IMAGING, TESTING) - I reviewed patient records, labs, notes, testing and imaging myself where available.  MMSE - Mini Mental State Exam 05/26/2020 10/01/2019 05/14/2019  Orientation to time 5 3 4   Orientation to Place 5 4 5   Registration 3 3 3   Attention/ Calculation 5 5 5   Recall 3 3 2   Language- name 2 objects 2 2 2   Language- repeat 1 1 1   Language- follow 3 step command 3 3 3   Language- read & follow direction 1 1 1   Write a sentence 1 1 1   Copy design 1 1 0  Total score 30 27 27      Lab Results  Component Value Date   WBC 8.4 09/18/2017   HGB 9.5 (L) 09/18/2017   HCT 29.3 (L) 09/18/2017   MCV 90.2 09/18/2017   PLT 195 09/18/2017      Component Value Date/Time   NA 142 05/14/2019 0953   K 4.4 05/14/2019 0953   CL 104 05/14/2019 0953   CL 103  09/16/2015 0000   CO2 24 05/14/2019 0953   GLUCOSE 94 05/14/2019 0953   GLUCOSE 114 (H) 09/16/2017 0410   BUN 18 05/14/2019 0953   CREATININE 0.79  05/14/2019 0953   CALCIUM 9.5 05/14/2019 0953   CALCIUM 9.6 09/16/2015 0000   PROT 7.7 10/20/2016 0923   ALBUMIN 5.0 (H) 10/20/2016 0923   AST 20 10/20/2016 0923   ALT 12 10/20/2016 0923   ALKPHOS 57 10/20/2016 0923   BILITOT 0.4 10/20/2016 0923   GFRNONAA 78 05/14/2019 0953   GFRNONAA 65 09/16/2015 0000   GFRAA 90 05/14/2019 0953   Lab Results  Component Value Date   CHOL 275 (H) 10/20/2016   HDL 111 10/20/2016   LDLCALC 147 (H) 10/20/2016   TRIG 85 10/20/2016   CHOLHDL 2.5 10/20/2016   Lab Results  Component Value Date   HGBA1C 5.6 10/20/2016   Lab Results  Component Value Date   VITAMINB12 1,366 (H) 05/14/2019   Lab Results  Component Value Date   TSH 0.621 01/24/2017       ASSESSMENT AND PLAN 70 y.o. year old female  has a past medical history of Anxiety, Arthritis, Diverticulitis, Headache, Hyperlipidemia, Hypothyroidism, and Thyroid disease. here with     ICD-10-CM   1. Amnestic MCI (mild cognitive impairment with memory loss)  G31.84 donepezil (ARICEPT) 10 MG tablet  2. Family history of dementia  Z81.8   3. Depression with anxiety  F41.8      Tisa feels that memory is fairly stable today.  She has continued Aricept but uncertain if she has taken escitalopram. We will increase Aricept to 10mg  at bedtime. I have asked her and her husband to check with pharmacy and look at home to see if she is taking escitalopram. May consider continuing if she wishes. MMSE 30/30. She was encouraged to stay mentally and physically active. Healthy lifestyle habits encouraged.  She will see me in 6 months, sooner if needed.  She verbalizes understanding and agreement with this plan.   No orders of the defined types were placed in this encounter.    Meds ordered this encounter  Medications  . donepezil (ARICEPT) 10 MG tablet     Sig: Take 1 tablet (10 mg total) by mouth at bedtime.    Dispense:  90 tablet    Refill:  3    Order Specific Question:   Supervising Provider    Answer:   Anson Fret      I spent 15 minutes with the patient. 50% of this time was spent counseling and educating patient on plan of care and medications.     [id number], FNP-C 05/26/2020, 12:31 PM [facility] [address], [address] [place], [place] [phone]   Made any corrections needed, and agree with history, physical, neuro exam,assessment and plan as stated.     [phone], MD [facility]

## 2020-05-26 NOTE — Patient Instructions (Signed)
  Below is our plan:  We will increase donapezil to 10mg  daily at bedtime. You may take two 5mg  tablets every night until you run out. Remember than new prescription will be for 10mg  (1 tablet) every night. Look to see if you are taking escitalopram 5mg . If you are, we can continue for now. If you have not taken this, you may choose to start the medication or wait until you see me again. Please call my office with an update.   Please make sure you are staying well hydrated. I recommend 50-60 ounces daily. Well balanced diet and regular exercise encouraged.    Please continue follow up with care team as directed.   Follow up with me in 6 months   You may receive a survey regarding today's visit. I encourage you to leave honest feed back as I do use this information to improve patient care. Thank you for seeing me today!      Memory Compensation Strategies  1. Use "WARM" strategy.  W= write it down  A= associate it  R= repeat it  M= make a mental note  2.   You can keep a .  Use a 3-ring notebook with sections for the following: calendar, important names and phone numbers,  medications, doctors' names/phone numbers, lists/reminders, and a section to journal what you did  each day.   3.    Use a calendar to write appointments down.  4.    Write yourself a schedule for the day.  This can be placed on the calendar or in a separate section of the Memory Notebook.  Keeping a  regular schedule can help memory.  5.    Use medication organizer with sections for each day or morning/evening pills.  You may need help loading it  6.    Keep a basket, or pegboard by the door.  Place items that you need to take out with you in the basket or on the pegboard.  You may also want to  include a message board for reminders.  7.    Use sticky notes.  Place sticky notes with reminders in a place where the task is performed.  For example: " turn off the  stove" placed by the stove, "lock  the door" placed on the door at eye level, " take your medications" on  the bathroom mirror or by the place where you normally take your medications.  8.    Use alarms/timers.  Use while cooking to remind yourself to check on food or as a reminder to take your medicine, or as a  reminder to make a call, or as a reminder to perform another task, etc.

## 2020-06-17 DIAGNOSIS — F321 Major depressive disorder, single episode, moderate: Secondary | ICD-10-CM | POA: Diagnosis not present

## 2020-06-17 DIAGNOSIS — G3184 Mild cognitive impairment, so stated: Secondary | ICD-10-CM | POA: Diagnosis not present

## 2020-06-17 DIAGNOSIS — E559 Vitamin D deficiency, unspecified: Secondary | ICD-10-CM | POA: Diagnosis not present

## 2020-06-17 DIAGNOSIS — M159 Polyosteoarthritis, unspecified: Secondary | ICD-10-CM | POA: Diagnosis not present

## 2020-06-17 DIAGNOSIS — E7800 Pure hypercholesterolemia, unspecified: Secondary | ICD-10-CM | POA: Diagnosis not present

## 2020-06-17 DIAGNOSIS — Z0000 Encounter for general adult medical examination without abnormal findings: Secondary | ICD-10-CM | POA: Diagnosis not present

## 2020-06-17 DIAGNOSIS — E2839 Other primary ovarian failure: Secondary | ICD-10-CM | POA: Diagnosis not present

## 2020-06-17 DIAGNOSIS — Z1389 Encounter for screening for other disorder: Secondary | ICD-10-CM | POA: Diagnosis not present

## 2020-06-17 DIAGNOSIS — Z23 Encounter for immunization: Secondary | ICD-10-CM | POA: Diagnosis not present

## 2020-06-17 DIAGNOSIS — N1831 Chronic kidney disease, stage 3a: Secondary | ICD-10-CM | POA: Diagnosis not present

## 2020-06-17 DIAGNOSIS — E039 Hypothyroidism, unspecified: Secondary | ICD-10-CM | POA: Diagnosis not present

## 2020-06-30 DIAGNOSIS — M5136 Other intervertebral disc degeneration, lumbar region: Secondary | ICD-10-CM | POA: Diagnosis not present

## 2020-06-30 DIAGNOSIS — M4316 Spondylolisthesis, lumbar region: Secondary | ICD-10-CM | POA: Diagnosis not present

## 2020-06-30 DIAGNOSIS — Z471 Aftercare following joint replacement surgery: Secondary | ICD-10-CM | POA: Diagnosis not present

## 2020-06-30 DIAGNOSIS — M5134 Other intervertebral disc degeneration, thoracic region: Secondary | ICD-10-CM | POA: Diagnosis not present

## 2020-06-30 DIAGNOSIS — Z96651 Presence of right artificial knee joint: Secondary | ICD-10-CM | POA: Diagnosis not present

## 2020-06-30 DIAGNOSIS — M160 Bilateral primary osteoarthritis of hip: Secondary | ICD-10-CM | POA: Diagnosis not present

## 2020-06-30 DIAGNOSIS — M545 Low back pain, unspecified: Secondary | ICD-10-CM | POA: Diagnosis not present

## 2020-06-30 DIAGNOSIS — M5030 Other cervical disc degeneration, unspecified cervical region: Secondary | ICD-10-CM | POA: Diagnosis not present

## 2020-06-30 DIAGNOSIS — M4317 Spondylolisthesis, lumbosacral region: Secondary | ICD-10-CM | POA: Diagnosis not present

## 2020-06-30 DIAGNOSIS — M48061 Spinal stenosis, lumbar region without neurogenic claudication: Secondary | ICD-10-CM | POA: Diagnosis not present

## 2020-06-30 DIAGNOSIS — M25561 Pain in right knee: Secondary | ICD-10-CM | POA: Diagnosis not present

## 2020-11-18 ENCOUNTER — Telehealth: Payer: Self-pay

## 2020-11-18 ENCOUNTER — Other Ambulatory Visit: Payer: Self-pay | Admitting: Neurology

## 2020-11-18 DIAGNOSIS — F419 Anxiety disorder, unspecified: Secondary | ICD-10-CM

## 2020-11-18 IMAGING — MR MR LUMBAR SPINE W/O CM
4 of 5 series · 18 of 48 positions shown · non-contrast
Comparison: 12/06/2016

CLINICAL DATA: Low back pain radiating down the right leg for 6
weeks

EXAM:
MRI LUMBAR SPINE WITHOUT CONTRAST
TECHNIQUE: Multiplanar, multisequence MR imaging of the lumbar spine was
performed. No intravenous contrast was administered.

[Series 5: T2 · sagittal · 4.0mm · 0.73mm/px · 6 of 14 slices shown (1 of 2)]
[im 1/14]
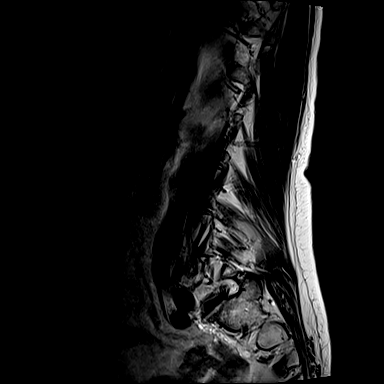
[im 3/14]
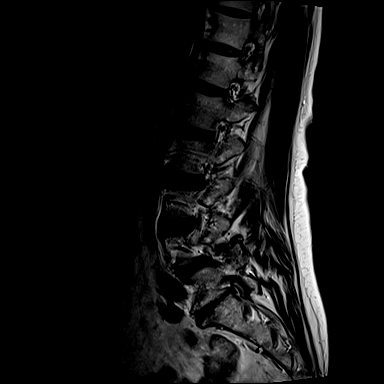
[im 6/14]
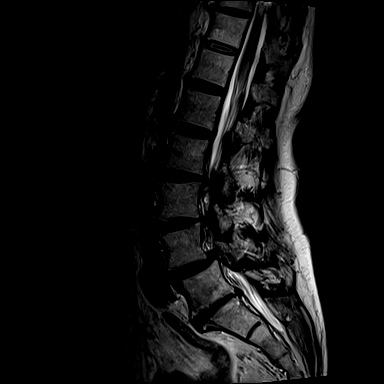
[im 8/14]
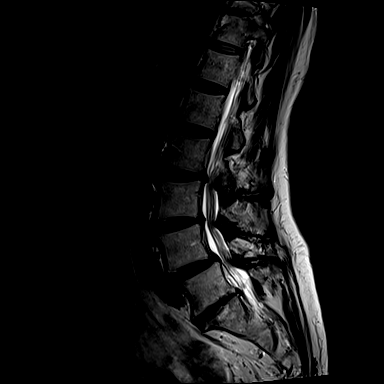
[im 11/14]
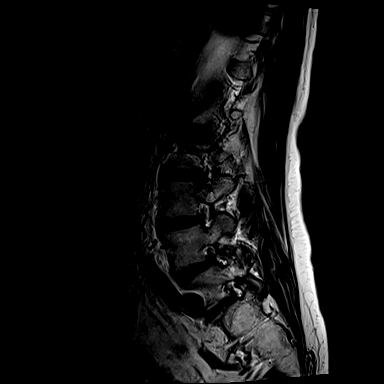
[im 14/14]
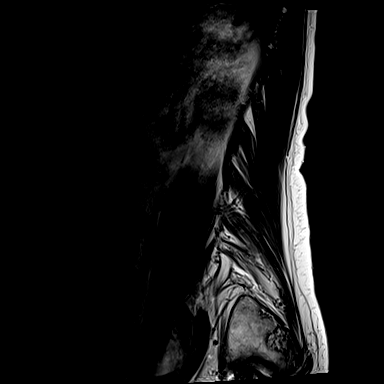

[Series 6: T1 · sagittal · 4.0mm · 0.73mm/px · 3 of 14 slices shown (1 of 2)]
[im 3/14]
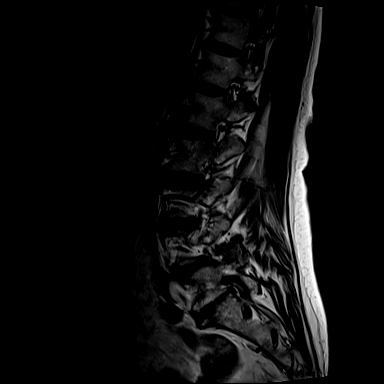
[im 8/14]
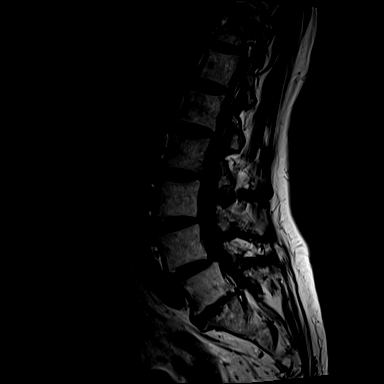
[im 14/14]
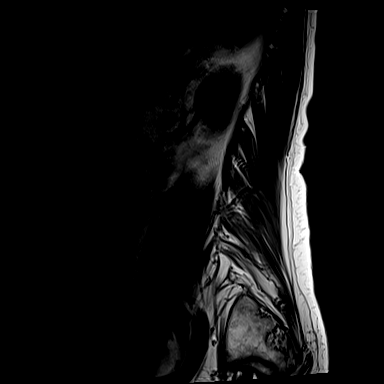

[Series 10: T1 · axial · 4.0mm · 0.28mm/px · z∈[-40,+118]mm · 3 of 38 slices shown (2 of 2)]
[im 6/38]
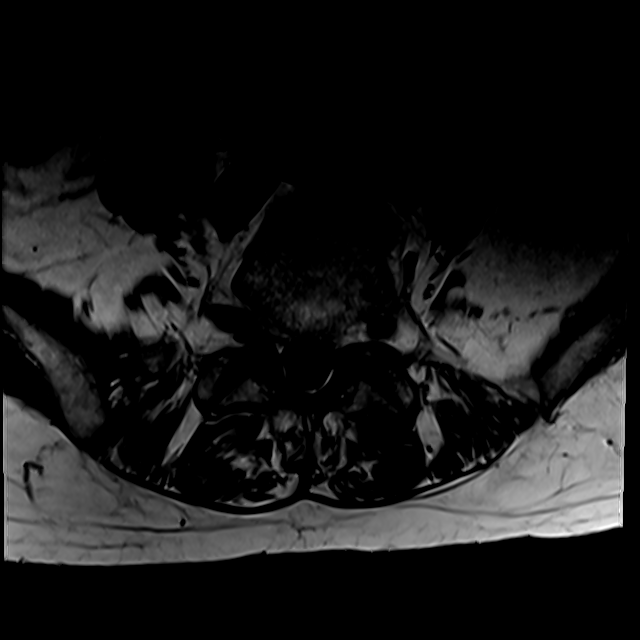
[im 19/38]
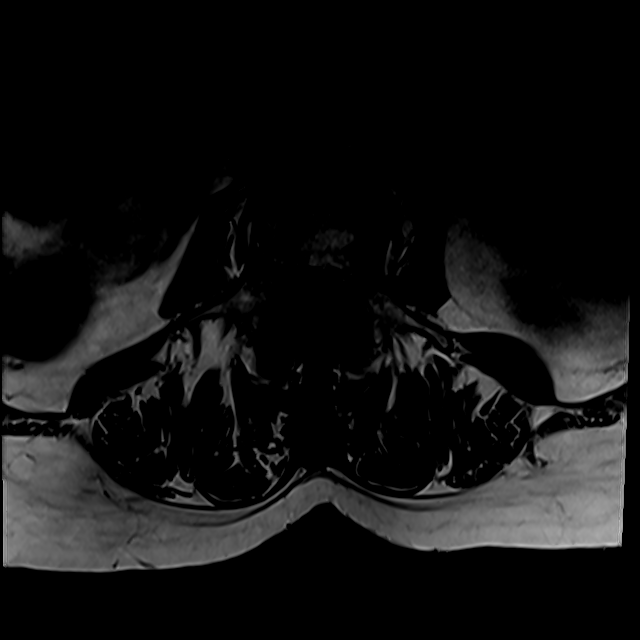
[im 32/38]
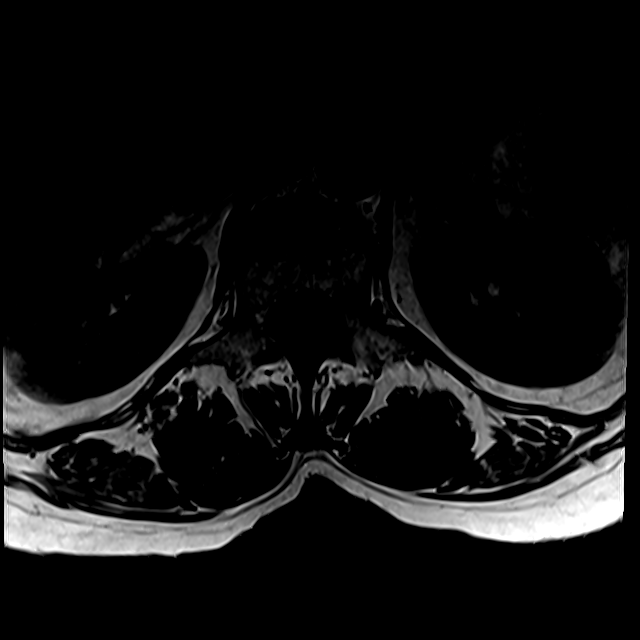

[Series 13: T2 · axial · 4.0mm · 0.28mm/px · z∈[-65,+118]mm · 6 of 38 slices shown (2 of 2)]
[im 1/38]
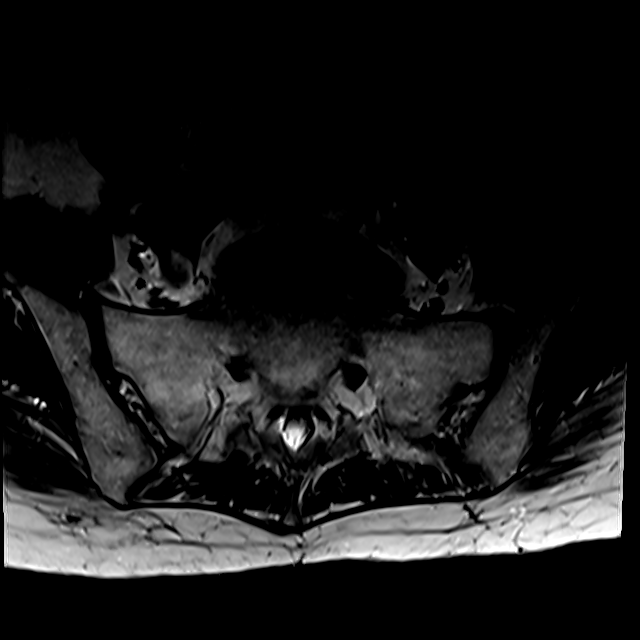
[im 6/38]
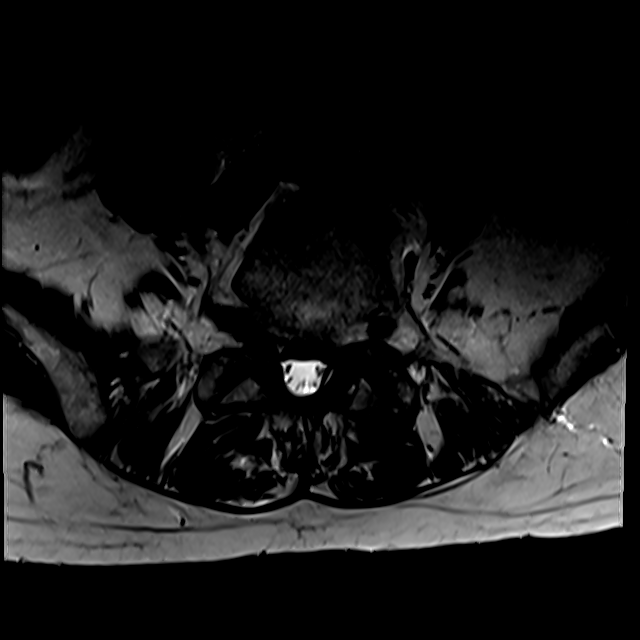
[im 11/38]
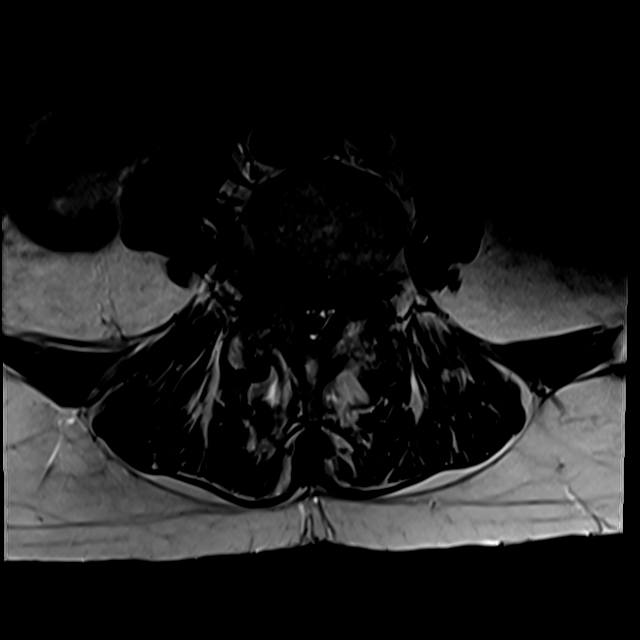
[im 16/38]
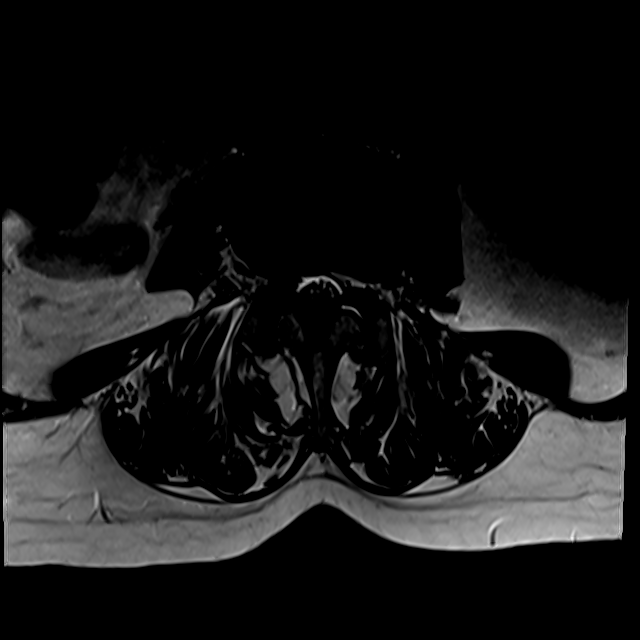
[im 19/38]
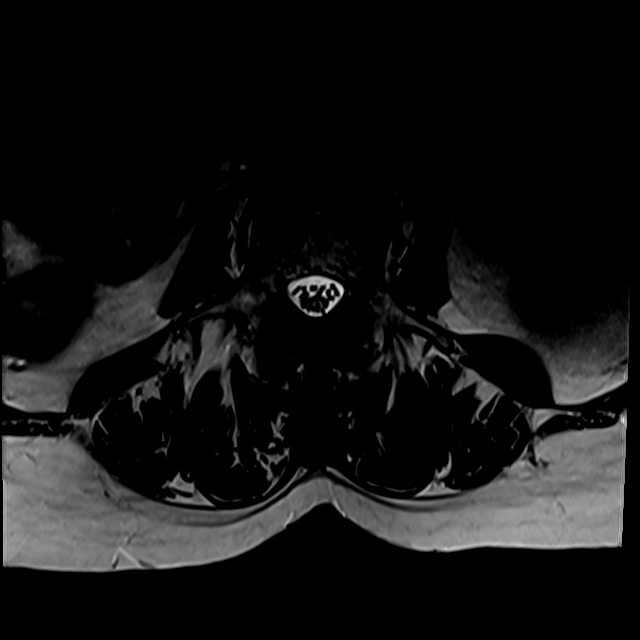
[im 32/38]
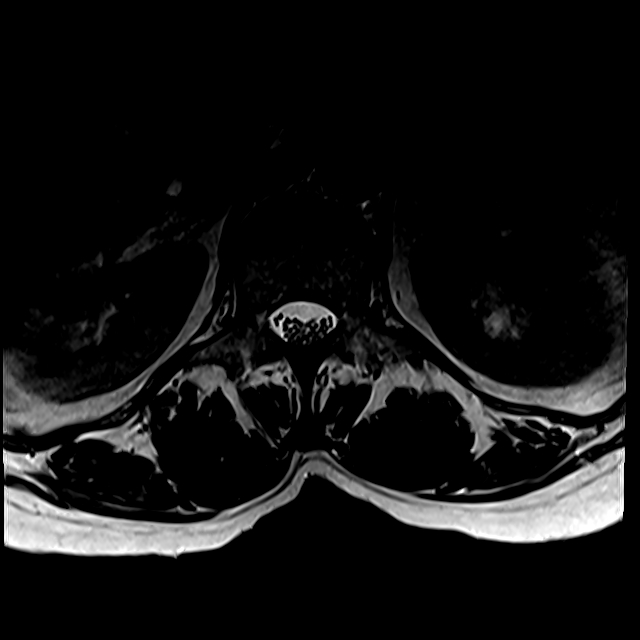

[18 of 48 positions shown; findings below may reference images not displayed]

FINDINGS: Segmentation:  Standard.

Alignment: Grade 1 anterolisthesis of L4 on L5 secondary to facet
disease.

Vertebrae:  No fracture, evidence of discitis, or bone lesion.

Conus medullaris and cauda equina: Conus extends to the T12 level.
Conus and cauda equina appear normal.

Paraspinal and other soft tissues: No acute paraspinal abnormality.

Disc levels:

Disc spaces: Degenerative disease with disc height loss at L5-S1.

T12-L1: No significant disc bulge. No evidence of neural foraminal
stenosis. No central canal stenosis.

L1-L2: No significant disc bulge. No evidence of neural foraminal
stenosis. No central canal stenosis.

L2-L3: Broad-based disc bulge flattening ventral thecal sac.
Moderate bilateral facet arthropathy. Moderate-severe spinal
stenosis. Mild bilateral foraminal stenosis.

L3-L4: Broad-based disc bulge. Moderate bilateral facet arthropathy.
Moderate spinal stenosis. Mild right foraminal narrowing. No left
foraminal narrowing. Bilateral lateral recess narrowing.

L4-L5: Broad-based disc bulge. Severe bilateral facet arthropathy.
Severe spinal stenosis. Mild bilateral foraminal stenosis.

L5-S1: Broad-based disc osteophyte complex. Moderate bilateral facet
arthropathy. Moderate right and mild left foraminal stenosis.
IMPRESSION: 1. At L2-3 there is a broad-based disc bulge flattening ventral
thecal sac. Moderate bilateral facet arthropathy. Moderate-severe
spinal stenosis. Mild bilateral foraminal stenosis.
2. At L3-4 there is a broad-based disc bulge. Moderate bilateral
facet arthropathy. Moderate spinal stenosis. Mild right foraminal
narrowing. No left foraminal narrowing. Bilateral lateral recess
narrowing.
3. At L4-5 there is a broad-based disc bulge. Severe bilateral facet
arthropathy. Severe spinal stenosis. Mild bilateral foraminal
stenosis.
4. At L5-S1 there is a broad-based disc osteophyte complex. Moderate
bilateral facet arthropathy. Moderate right and mild left foraminal
stenosis.

## 2020-11-18 MED ORDER — ESCITALOPRAM OXALATE 5 MG PO TABS: 5.0000 mg | ORAL_TABLET | Freq: Every day | ORAL | 0 refills | Status: DC

## 2020-11-18 NOTE — Telephone Encounter (Signed)
[facility] Judeth Cornfield) called, patient keep requesting refills for Escitalopram. Told her we would follow up with [facility]. Would like a call from the nurse to clarify.

## 2020-11-18 NOTE — Telephone Encounter (Signed)
Called and spoke to pt. Pt has been taking this medication. Pt asking for refill to be sent to [facility].

## 2020-11-18 NOTE — Telephone Encounter (Signed)
Called and spoke to pt regarding a refill request for escitalopram 5mg . We are needing clarification is she is taking or not.  On 10/01/19 Amy, NP added escitalopram 5mg  daily.   On 05/26/20, pt "is uncertain if she has continued this medication."

## 2020-11-18 NOTE — Telephone Encounter (Addendum)
Called pharmacy and verified pt has been picking medication regularly. Last pickup was 07/29/20  Per Amy refilled for 30 days only.

## 2020-11-18 NOTE — Telephone Encounter (Signed)
LVM for pt to call office back.

## 2020-11-18 NOTE — Addendum Note (Signed)
Addended by: Melanee Spry on: 11/18/2020 04:43 PM   Modules accepted: Orders

## 2020-11-24 NOTE — Patient Instructions (Signed)
Below is our plan:  We will continue donepezil 10mg  daily at bedtime, escitalopram 5mg  daily at bedtime and I will add memantine 5mg  daily for 2 weeks then increase dose to 5mg  twice daily. We can increase dose further if you wish, call if you would like to increase before follow up in January.   Please make sure you are staying well hydrated. I recommend 50-60 ounces daily. Well balanced diet and regular exercise encouraged. Consistent sleep schedule with 6-8 hours recommended.   Please continue follow up with care team as directed.   Follow up with me in 4-5 months   You may receive a survey regarding today's visit. I encourage you to leave honest feed back as I do use this information to improve patient care. Thank you for seeing me today!    Management of Memory Problems   There are some general things you can do to help manage your memory problems.  Your memory may not in fact recover, but by using techniques and strategies you will be able to manage your memory difficulties better.   1)  Establish a routine. Try to establish and then stick to a regular routine.  By doing this, you will get used to what to expect and you will reduce the need to rely on your memory.  Also, try to do things at the same time of day, such as taking your medication or checking your calendar first thing in the morning. Think about think that you can do as a part of a regular routine and make a list.  Then enter them into a daily planner to remind you.  This will help you establish a routine.   2)  Organize your environment. Organize your environment so that it is uncluttered.  Decrease visual stimulation.  Place everyday items such as keys or cell phone in the same place every day (ie.  Basket next to front door) Use post it notes with a brief message to yourself (ie. Turn off light, lock the door) Use labels to indicate where things go (ie. Which cupboards are for food, dishes, etc.) Keep a notepad and pen  by the telephone to take messages   3)  Memory Aids A diary or journal/notebook/daily planner Making a list (shopping list, chore list, to do list that needs to be done) Using an alarm as a reminder (kitchen timer or cell phone alarm) Using cell phone to store information (Notes, Calendar, Reminders) Calendar/White board placed in a prominent position Post-it notes   In order for memory aids to be useful, you need to have good habits.  It's no good remembering to make a note in your journal if you don't remember to look in it.  Try setting aside a certain time of day to look in journal.   4)  Improving mood and managing fatigue. There may be other factors that contribute to memory difficulties.  Factors, such as anxiety, depression and tiredness can affect memory. Regular gentle exercise can help improve your mood and give you more energy. Simple relaxation techniques may help relieve symptoms of anxiety Try to get back to completing activities or hobbies you enjoyed doing in the past. Learn to pace yourself through activities to decrease fatigue. Find out about some local support groups where you can share experiences with others. Try and achieve 7-8 hours of sleep at night.

## 2020-11-24 NOTE — Progress Notes (Signed)
PATIENT: Amanda Solis DOB: 1950/11/07  REASON FOR VISIT: follow up HISTORY FROM: patient  Chief Complaint  Patient presents with   Follow-up    RM 12 w/ husband. Last seen 05/26/20. MMSE today: 27/30 [place] Spaniel it was July, Tuesday and missed 1 of the three words to repeat back) 13 years of education. No new sx, doing well.      HISTORY OF PRESENT ILLNESS: 11/25/2020 ALL:  Clarke returns for follow up for MCI. We increased donepezil to 10mg  daily and continued escitalopram 5mg  at last visit. She has tolerated medicaitons well. She feels she is doing ok. No significant changes. Her husband feels she continues to have difficulty with short term memory loss. She has a hard time remembering where she placed items around the house. No difficulty managing medicaitons or driving.   05/26/2020 ALL:  She returns today for follow up for MCI. She continues Aricept 5mg  daily. We added escitalopram 5mg  at last visit due to concerns of anxiety. She is uncertain if she has continued this medication. No obvious side effects that she can remember. She presents with her husband, today, who aids in history. They report that memory is fairly stable. Still having difficulty with short term memory. She continues to perform ADLs independently. Able to assist with managing home, finances and grocery shopping. Driving without difficulty. She walks with her daughter two days a week and goes to water aerobics regularly.    10/01/2019 ALL:  Amanda Solis is a 70 y.o. female here today for follow up for memory loss. She continues Aricept. She is not sure it has helped much. She is tolerating well. Neurocognitive testing showed MCI. She does have a strong family history of dementia. She is able to perform ADL's independently. She drives without difficulty. She is able to manage finances. She does endorse anxiety. She has chronic back pain. She is considering surgery but is worried about if it is the right thing for  her. She is seeking a second opinion. She walks with her daughter, regularly. She volunteers at every Monday. She plays games on her phone often.    HISTORY: (copied from Dr [name] note on 05/14/2019)  HPI:  Amanda Solis is a 70 y.o. female here as requested by Trevor Mace, PA for memory changes with a strong family history of Alzheimer's disease.  She has a past medical history of thyroid disease/hypothyroidism, hyperlipidemia, headache, chronic low back pain and spinal stenosis (Dr. 05/16/2019), anxiety, insomnia, arthritis, anxiety, insomnia, family history of Alzheimer's disease.  I reviewed [name] notes, patient complaining of recent decline in memory, her thyroid had continued to remain low despite reductions in dosing of levothyroxine and she was referred to endocrinologist, due to reduced dose medication and her TSH remaining high and concerns with memory decline she was sent to her hormonal specialist in 73 who has been prescribing her multiple hormones and treatments over the last 6 months, also managing her thyroid levels,, unfortunately the patient has not seen any improvement in fatigue or memory decline.  Per notes CBC, CMP, TSH, vitamin D, vitamin B12 and lipid panel all were essentially normal (TSH 0.88) labs drawn March 05, 2019.  I did review these labs and agree that they were essentially normal except for vitamin B12 at 219 which can still signify B12 deficiency and we will have to retest that along with methylmalonic acid.  On review of multiple other notes, patient complaining of memory declined, often  forgetting things, short-term and longer term, she misplaces items, worried about Alzheimer's given her family history, she also has a history of anxiety on Lexapro, hot flashes has resolved, insomnia, she also has chronic pain severe stenosis L4-L5 and receiving injections with [name] and declined surgery.  I also reviewed several years of  epic notes for more history and memory loss which I did not find.  Also did not find anything in care everywhere.  No imaging of the brain as far as I can see.   Here with her husband who also provides information. Started a year ago slowly with forgetting things, repeating things, they saw a metabolic specialist in [place] and they were referred here. She loses her phone quite often. No problems driving, she was lost once and is not driving as much, she does all the accounting of the bills and not missing bills, she checks everything thoroughly and keeps tracks of credit cards, keeps a spreadsheet very detailed, not missing things, she manages her own medication. She feels worse since Covid, more isolated, she is worried about Covid, she has chronic low back pain, she is worried about anesthesia and will not get surgery on her back, her sister Amanda Solis is oldest sister close to [address] in [place] and within the last few years but unclear if diagnosed with dementia, her brother had dementia in a nursing home unclear, mother had dementia totally didn;t know people or talk but unknown type of dementia. SHe maybe feels her knee surgery 2 years ago may have started her symptoms, now she has chronic pain. Long-term is fine, short-term is more affected. Denies depression, she has some anxiety. She appears to have mood changes, personality changes, more emotional, no hallunications or delusions.    Reviewed notes, labs and imaging from outside physicians, which showed: see above   REVIEW OF SYSTEMS: Out of a complete 14 system review of symptoms, the patient complains only of the following symptoms, memory loss, anxiety, chronic pain, and all other reviewed systems are negative.  ALLERGIES: No Known Allergies  HOME MEDICATIONS: Outpatient Medications Prior to Visit  Medication Sig Dispense Refill   Ascorbic Acid (VITAMIN C PO) Take 500 mg by mouth daily.     Cholecalciferol (VITAMIN D3 PO) Take 50 mcg by mouth  daily.     Cyanocobalamin (VITAMIN B-12 PO) Take 2,500 mcg by mouth daily.     donepezil (ARICEPT) 10 MG tablet Take 1 tablet (10 mg total) by mouth at bedtime. 90 tablet 3   levothyroxine (SYNTHROID) 75 MCG tablet Take 75 mcg by mouth daily.     simvastatin (ZOCOR) 20 MG tablet TAKE 1 TABLET BY MOUTH DAILY AT 6 PM. PATIENT NEEDS OFFICE VISIT FOR FURTHER REFILLS 15 tablet 0   escitalopram (LEXAPRO) 5 MG tablet Take 1 tablet (5 mg total) by mouth daily. 30 tablet 0   No facility-administered medications prior to visit.    PAST MEDICAL HISTORY: Past Medical History:  Diagnosis Date   Anxiety    Arthritis    Diverticulitis    in the past   Headache    Hyperlipidemia    Hypothyroidism    Thyroid disease     PAST SURGICAL HISTORY: Past Surgical History:  Procedure Laterality Date   APPENDECTOMY     CHOLECYSTECTOMY     TOTAL KNEE ARTHROPLASTY Right 09/15/2017   Procedure: RIGHT TOTAL KNEE ARTHROPLASTY;  Surgeon: Beverely Low, MD;  Location: [facility] OR;  Service: Orthopedics;  Laterality: Right;   TUBAL LIGATION  VARICOSE VEIN SURGERY      FAMILY HISTORY: Family History  Problem Relation Age of Onset   Diabetes Mother    Alzheimer's disease Mother    Prostate cancer Father    Alcohol abuse Father    Diabetes Father    Breast cancer Sister    Alzheimer's disease Sibling    Alzheimer's disease Sibling    Colon cancer Neg Hx     SOCIAL HISTORY: Social History   Socioeconomic History   Marital status: Married    Spouse name: Not on file   Number of children: 3   Years of education: 13   Highest education level: Not on file  Occupational History   Not on file  Tobacco Use   Smoking status: Former    Packs/day: 0.25    Years: 2.00    Pack years: 0.50    Types: Cigarettes    Quit date: 1978    Years since quitting: 44.6   Smokeless tobacco: Never  Vaping Use   Vaping Use: Never used  Substance and Sexual Activity   Alcohol use: Yes    Alcohol/week: 1.0 standard  drink    Types: 1 Glasses of wine per week   Drug use: Never   Sexual activity: Yes    Birth control/protection: Surgical  Other Topics Concern   Not on file  Social History Narrative   Lives at home with spouse   Right handed   Caffeine: 2 cups coffee/day   Social Determinants of Health   Financial Resource Strain: Not on file  Food Insecurity: Not on file  Transportation Needs: Not on file  Physical Activity: Not on file  Stress: Not on file  Social Connections: Not on file  Intimate Partner Violence: Not on file      PHYSICAL EXAM  Vitals:   11/25/20 1039  BP: 122/68  Pulse: 69  Weight: 135 lb (61.2 kg)  Height: 5\' 1"  (1.549 m)    Body mass index is 25.51 kg/m.  Generalized: Well developed, in no acute distress  Cardiology: normal rate and rhythm, no murmur noted Respiratory: clear to auscultation bilaterally  Neurological examination  Mentation: Alert oriented to time, place, history taking. Follows all commands speech and language fluent Cranial nerve II-XII: Pupils were equal round reactive to light. Extraocular movements were full, visual field were full on confrontational test. Facial sensation and strength were normal.  Head turning and shoulder shrug  were normal and symmetric. Motor: The motor testing reveals 5 over 5 strength of all 4 extremities. Good symmetric motor tone is noted throughout.  Sensory: Sensory testing is intact to soft touch on all 4 extremities. No evidence of extinction is noted.  Coordination: Cerebellar testing reveals good finger-nose-finger and heel-to-shin bilaterally.  Gait and station: Gait is normal.   DIAGNOSTIC DATA (LABS, IMAGING, TESTING) - I reviewed patient records, labs, notes, testing and imaging myself where available.  MMSE - Mini Mental State Exam 11/25/2020 05/26/2020 10/01/2019  Orientation to time 3 5 3   Orientation to Place 5 5 4   Registration 3 3 3   Attention/ Calculation 5 5 5   Recall 2 3 3   Language- name 2  objects 2 2 2   Language- repeat 1 1 1   Language- follow 3 step command 3 3 3   Language- read & follow direction 1 1 1   Write a sentence 1 1 1   Copy design 1 1 1   Total score 27 30 27      Lab Results  Component Value Date  WBC 8.4 09/18/2017   HGB 9.5 (L) 09/18/2017   HCT 29.3 (L) 09/18/2017   MCV 90.2 09/18/2017   PLT 195 09/18/2017      Component Value Date/Time   NA 142 05/14/2019 0953   K 4.4 05/14/2019 0953   CL 104 05/14/2019 0953   CL 103 09/16/2015 0000   CO2 24 05/14/2019 0953   GLUCOSE 94 05/14/2019 0953   GLUCOSE 114 (H) 09/16/2017 0410   BUN 18 05/14/2019 0953   CREATININE 0.79 05/14/2019 0953   CALCIUM 9.5 05/14/2019 0953   CALCIUM 9.6 09/16/2015 0000   PROT 7.7 10/20/2016 0923   ALBUMIN 5.0 (H) 10/20/2016 0923   AST 20 10/20/2016 0923   ALT 12 10/20/2016 0923   ALKPHOS 57 10/20/2016 0923   BILITOT 0.4 10/20/2016 0923   GFRNONAA 78 05/14/2019 0953   GFRNONAA 65 09/16/2015 0000   GFRAA 90 05/14/2019 0953   Lab Results  Component Value Date   CHOL 275 (H) 10/20/2016   HDL 111 10/20/2016   LDLCALC 147 (H) 10/20/2016   TRIG 85 10/20/2016   CHOLHDL 2.5 10/20/2016   Lab Results  Component Value Date   HGBA1C 5.6 10/20/2016   Lab Results  Component Value Date   VITAMINB12 1,366 (H) 05/14/2019   Lab Results  Component Value Date   TSH 0.621 01/24/2017       ASSESSMENT AND PLAN 70 y.o. year old female  has a past medical history of Anxiety, Arthritis, Diverticulitis, Headache, Hyperlipidemia, Hypothyroidism, and Thyroid disease. here with     ICD-10-CM   1. Amnestic MCI (mild cognitive impairment with memory loss)  G31.84         Steward DroneBrenda feels that memory is fairly stable today, however, her husband notes that she continues to have difficulty with short term memory and recall.  MMSE 27/30. I will have her continue donepezil 10mg  daily and escitalopram 5mg  daily. I will add memantine 5mg  twice daily and plan to tirrate to 10mg  BID pending  tolerability. She was encouraged to stay mentally and physically active. Healthy lifestyle habits encouraged.  She will see me in 4-6 months, sooner if needed.  She verbalizes understanding and agreement with this plan.   No orders of the defined types were placed in this encounter.    Meds ordered this encounter  Medications   memantine (NAMENDA) 5 MG tablet    Sig: Take 1 tablet (5 mg total) by mouth 2 (two) times daily.    Dispense:  180 tablet    Refill:  1    Order Specific Question:   Supervising Provider    Answer:   Anson FretAHERN, ANTONIA B [[id number]]   escitalopram (LEXAPRO) 5 MG tablet    Sig: Take 1 tablet (5 mg total) by mouth daily.    Dispense:  90 tablet    Refill:  1    Order Specific Question:   Supervising Provider    Answer:   Bernestine AmassHERN, ANTONIA B [[id number]]       Talin Rozeboom, FNP-C 11/25/2020, 12:18 PM [facility] [address], [address] [place], [place] [address] [phone]

## 2020-11-25 ENCOUNTER — Encounter: Payer: Self-pay | Admitting: Family Medicine

## 2020-11-25 ENCOUNTER — Ambulatory Visit: Payer: PPO | Admitting: Family Medicine

## 2020-11-25 VITALS — BP 122/68 | HR 69 | Ht 61.0 in | Wt 135.0 lb

## 2020-11-25 DIAGNOSIS — G3184 Mild cognitive impairment, so stated: Secondary | ICD-10-CM

## 2020-11-25 MED ORDER — MEMANTINE HCL 5 MG PO TABS: 5.0000 mg | ORAL_TABLET | Freq: Two times a day (BID) | ORAL | 1 refills | Status: DC

## 2020-11-25 MED ORDER — ESCITALOPRAM OXALATE 5 MG PO TABS: 5.0000 mg | ORAL_TABLET | Freq: Every day | ORAL | 1 refills | Status: DC

## 2020-12-22 DIAGNOSIS — Z96651 Presence of right artificial knee joint: Secondary | ICD-10-CM | POA: Diagnosis not present

## 2020-12-22 DIAGNOSIS — M5030 Other cervical disc degeneration, unspecified cervical region: Secondary | ICD-10-CM | POA: Diagnosis not present

## 2020-12-22 DIAGNOSIS — M5134 Other intervertebral disc degeneration, thoracic region: Secondary | ICD-10-CM | POA: Diagnosis not present

## 2020-12-22 DIAGNOSIS — Z471 Aftercare following joint replacement surgery: Secondary | ICD-10-CM | POA: Diagnosis not present

## 2020-12-22 DIAGNOSIS — M47819 Spondylosis without myelopathy or radiculopathy, site unspecified: Secondary | ICD-10-CM | POA: Diagnosis not present

## 2020-12-22 DIAGNOSIS — M5136 Other intervertebral disc degeneration, lumbar region: Secondary | ICD-10-CM | POA: Diagnosis not present

## 2020-12-22 DIAGNOSIS — M4316 Spondylolisthesis, lumbar region: Secondary | ICD-10-CM | POA: Diagnosis not present

## 2020-12-22 DIAGNOSIS — M21161 Varus deformity, not elsewhere classified, right knee: Secondary | ICD-10-CM | POA: Diagnosis not present

## 2020-12-22 DIAGNOSIS — M48061 Spinal stenosis, lumbar region without neurogenic claudication: Secondary | ICD-10-CM | POA: Diagnosis not present

## 2020-12-31 DIAGNOSIS — M48061 Spinal stenosis, lumbar region without neurogenic claudication: Secondary | ICD-10-CM | POA: Diagnosis not present

## 2020-12-31 DIAGNOSIS — M47816 Spondylosis without myelopathy or radiculopathy, lumbar region: Secondary | ICD-10-CM | POA: Diagnosis not present

## 2021-01-14 DIAGNOSIS — M48062 Spinal stenosis, lumbar region with neurogenic claudication: Secondary | ICD-10-CM | POA: Diagnosis not present

## 2021-01-14 DIAGNOSIS — M25561 Pain in right knee: Secondary | ICD-10-CM | POA: Diagnosis not present

## 2021-01-19 DIAGNOSIS — M25561 Pain in right knee: Secondary | ICD-10-CM | POA: Diagnosis not present

## 2021-01-19 DIAGNOSIS — M48062 Spinal stenosis, lumbar region with neurogenic claudication: Secondary | ICD-10-CM | POA: Diagnosis not present

## 2021-01-26 DIAGNOSIS — M25561 Pain in right knee: Secondary | ICD-10-CM | POA: Diagnosis not present

## 2021-01-26 DIAGNOSIS — M48062 Spinal stenosis, lumbar region with neurogenic claudication: Secondary | ICD-10-CM | POA: Diagnosis not present

## 2021-01-28 DIAGNOSIS — M25561 Pain in right knee: Secondary | ICD-10-CM | POA: Diagnosis not present

## 2021-01-28 DIAGNOSIS — M48062 Spinal stenosis, lumbar region with neurogenic claudication: Secondary | ICD-10-CM | POA: Diagnosis not present

## 2021-02-02 DIAGNOSIS — M25561 Pain in right knee: Secondary | ICD-10-CM | POA: Diagnosis not present

## 2021-02-02 DIAGNOSIS — M48062 Spinal stenosis, lumbar region with neurogenic claudication: Secondary | ICD-10-CM | POA: Diagnosis not present

## 2021-02-04 DIAGNOSIS — M48062 Spinal stenosis, lumbar region with neurogenic claudication: Secondary | ICD-10-CM | POA: Diagnosis not present

## 2021-02-04 DIAGNOSIS — M25561 Pain in right knee: Secondary | ICD-10-CM | POA: Diagnosis not present

## 2021-02-09 DIAGNOSIS — M25561 Pain in right knee: Secondary | ICD-10-CM | POA: Diagnosis not present

## 2021-02-09 DIAGNOSIS — M48062 Spinal stenosis, lumbar region with neurogenic claudication: Secondary | ICD-10-CM | POA: Diagnosis not present

## 2021-02-11 DIAGNOSIS — M48062 Spinal stenosis, lumbar region with neurogenic claudication: Secondary | ICD-10-CM | POA: Diagnosis not present

## 2021-02-11 DIAGNOSIS — H524 Presbyopia: Secondary | ICD-10-CM | POA: Diagnosis not present

## 2021-02-11 DIAGNOSIS — H2513 Age-related nuclear cataract, bilateral: Secondary | ICD-10-CM | POA: Diagnosis not present

## 2021-02-11 DIAGNOSIS — H5203 Hypermetropia, bilateral: Secondary | ICD-10-CM | POA: Diagnosis not present

## 2021-02-11 DIAGNOSIS — M25561 Pain in right knee: Secondary | ICD-10-CM | POA: Diagnosis not present

## 2021-02-11 DIAGNOSIS — H52222 Regular astigmatism, left eye: Secondary | ICD-10-CM | POA: Diagnosis not present

## 2021-03-04 DIAGNOSIS — M25561 Pain in right knee: Secondary | ICD-10-CM | POA: Diagnosis not present

## 2021-03-04 DIAGNOSIS — M48062 Spinal stenosis, lumbar region with neurogenic claudication: Secondary | ICD-10-CM | POA: Diagnosis not present

## 2021-03-11 DIAGNOSIS — M48062 Spinal stenosis, lumbar region with neurogenic claudication: Secondary | ICD-10-CM | POA: Diagnosis not present

## 2021-03-11 DIAGNOSIS — M25561 Pain in right knee: Secondary | ICD-10-CM | POA: Diagnosis not present

## 2021-03-16 DIAGNOSIS — Z01419 Encounter for gynecological examination (general) (routine) without abnormal findings: Secondary | ICD-10-CM | POA: Diagnosis not present

## 2021-03-16 DIAGNOSIS — M25561 Pain in right knee: Secondary | ICD-10-CM | POA: Diagnosis not present

## 2021-03-16 DIAGNOSIS — Z1231 Encounter for screening mammogram for malignant neoplasm of breast: Secondary | ICD-10-CM | POA: Diagnosis not present

## 2021-03-16 DIAGNOSIS — Z6826 Body mass index (BMI) 26.0-26.9, adult: Secondary | ICD-10-CM | POA: Diagnosis not present

## 2021-03-16 DIAGNOSIS — M48062 Spinal stenosis, lumbar region with neurogenic claudication: Secondary | ICD-10-CM | POA: Diagnosis not present

## 2021-03-23 DIAGNOSIS — M25561 Pain in right knee: Secondary | ICD-10-CM | POA: Diagnosis not present

## 2021-03-23 DIAGNOSIS — M48062 Spinal stenosis, lumbar region with neurogenic claudication: Secondary | ICD-10-CM | POA: Diagnosis not present

## 2021-03-25 DIAGNOSIS — M25561 Pain in right knee: Secondary | ICD-10-CM | POA: Diagnosis not present

## 2021-03-25 DIAGNOSIS — M48062 Spinal stenosis, lumbar region with neurogenic claudication: Secondary | ICD-10-CM | POA: Diagnosis not present

## 2021-03-30 DIAGNOSIS — M48062 Spinal stenosis, lumbar region with neurogenic claudication: Secondary | ICD-10-CM | POA: Diagnosis not present

## 2021-03-30 DIAGNOSIS — M25561 Pain in right knee: Secondary | ICD-10-CM | POA: Diagnosis not present

## 2021-04-01 DIAGNOSIS — M25561 Pain in right knee: Secondary | ICD-10-CM | POA: Diagnosis not present

## 2021-04-01 DIAGNOSIS — M48062 Spinal stenosis, lumbar region with neurogenic claudication: Secondary | ICD-10-CM | POA: Diagnosis not present

## 2021-04-05 NOTE — Progress Notes (Signed)
PATIENT: Amanda Solis DOB: 1950-07-16  REASON FOR VISIT: follow up HISTORY FROM: patient  Chief Complaint  Patient presents with   Follow-up    Room 16. Pt here with daughter. Last seen 4 mo ago, MMSE was 27/30. Pt may never have started the memantine prescribed at last visit.       HISTORY OF PRESENT ILLNESS: 04/06/2021 ALL: [name] returns for follow up for MCI. We continued donepezil  and escitalopram  QD and added memantine  BID at last visit 11/2020. Since, She is doing about the same. No specific changes. She did not start memantine. She is not sure why. She would like to start now. She continues to volunteer with [employer]. She goes to to the [facility] twice weekly. She is driving without difficulty.   11/25/2020 ALL:  [name] returns for follow up for MCI. We increased donepezil to  daily and continued escitalopram  at last visit. She has tolerated medicaitons well. She feels she is doing ok. No significant changes. Her husband feels she continues to have difficulty with short term memory loss. She has a hard time remembering where she placed items around the house. No difficulty managing medicaitons or driving.   05/26/2020 ALL:  She returns today for follow up for MCI. She continues Aricept  daily. We added escitalopram  at last visit due to concerns of anxiety. She is uncertain if she has continued this medication. No obvious side effects that she can remember. She presents with her husband, today, who aids in history. They report that memory is fairly stable. Still having difficulty with short term memory. She continues to perform ADLs independently. Able to assist with managing home, finances and grocery shopping. Driving without difficulty. She walks with her daughter two days a week and goes to water aerobics regularly. She was working with PT for right knee pain but recently discharged for meeting goals.    10/01/2019 ALL:  Amanda Solis is  a 70 y.o. female here today for follow up for memory loss. She continues Aricept. She is not sure it has helped much. She is tolerating well. Neurocognitive testing showed MCI. She does have a strong family history of dementia. She is able to perform ADL's independently. She drives without difficulty. She is able to manage finances. She does endorse anxiety. She has chronic back pain. She is considering surgery but is worried about if it is the right thing for her. She is seeking a second opinion. She walks with her daughter, regularly. She volunteers at [employer] every Monday. She plays games on her phone often.    HISTORY: (copied from [name] note on 05/14/2019)  HPI:  Amanda Solis is a 70 y.o. female here as requested by [name], PA for memory changes with a strong family history of Alzheimer's disease.  She has a past medical history of thyroid disease/hypothyroidism, hyperlipidemia, headache, chronic low back pain and spinal stenosis ([name]), anxiety, insomnia, arthritis, anxiety, insomnia, family history of Alzheimer's disease.  I reviewed Amanda Solis notes, patient complaining of recent decline in memory, her thyroid had continued to remain low despite reductions in dosing of levothyroxine and she was referred to endocrinologist, due to reduced dose medication and her TSH remaining high and concerns with memory decline she was sent to her hormonal specialist in [place] who has been prescribing her multiple hormones and treatments over the last 6 months, also managing her thyroid levels,, unfortunately the patient has not  seen any improvement in fatigue or memory decline.  Per notes CBC, CMP, TSH, vitamin D, vitamin B12 and lipid panel all were essentially normal (TSH 0.88) labs drawn March 05, 2019.  I did review these labs and agree that they were essentially normal except for vitamin B12 at 219 which can still signify B12 deficiency and we will have to retest  that along with methylmalonic acid.  On review of multiple other notes, patient complaining of memory declined, often forgetting things, short-term and longer term, she misplaces items, worried about Alzheimer's given her family history, she also has a history of anxiety on Lexapro, hot flashes has resolved, insomnia, she also has chronic pain severe stenosis L4-L5 and receiving injections with [name] and declined surgery.  I also reviewed several years of epic notes for more history and memory loss which I did not find.  Also did not find anything in care everywhere.  No imaging of the brain as far as I can see.   Here with her husband who also provides information. Started a year ago slowly with forgetting things, repeating things, they saw a metabolic specialist in [place] and they were referred here. She loses her phone quite often. No problems driving, she was lost once and is not driving as much, she does all the accounting of the bills and not missing bills, she checks everything thoroughly and keeps tracks of credit cards, keeps a spreadsheet very detailed, not missing things, she manages her own medication. She feels worse since Covid, more isolated, she is worried about Covid, she has chronic low back pain, she is worried about anesthesia and will not get surgery on her back, her sister Amanda Solis is oldest sister close to 80 in [place] and within the last few years but unclear if diagnosed with dementia, her brother had dementia in a nursing home unclear, mother had dementia totally didn;t know people or talk but unknown type of dementia. SHe maybe feels her knee surgery 2 years ago may have started her symptoms, now she has chronic pain. Long-term is fine, short-term is more affected. Denies depression, she has some anxiety. She appears to have mood changes, personality changes, more emotional, no hallunications or delusions.    Reviewed notes, labs and imaging from outside physicians, which  showed: see above   REVIEW OF SYSTEMS: Out of a complete 14 system review of symptoms, the patient complains only of the following symptoms, memory loss, anxiety, chronic pain, and all other reviewed systems are negative.  ALLERGIES: No Known Allergies  HOME MEDICATIONS: Outpatient Medications Prior to Visit  Medication Sig Dispense Refill   Ascorbic Acid (VITAMIN C PO) Take 500 mg by mouth daily.     Cholecalciferol (VITAMIN D3 PO) Take 50 mcg by mouth daily.     Cyanocobalamin (VITAMIN B-12 PO) Take 2,500 mcg by mouth daily.     donepezil (ARICEPT) 10 MG tablet Take 1 tablet (10 mg total) by mouth at bedtime. 90 tablet 3   escitalopram (LEXAPRO) 5 MG tablet Take 1 tablet (5 mg total) by mouth daily. 90 tablet 1   levothyroxine (SYNTHROID) 75 MCG tablet Take 75 mcg by mouth daily.     simvastatin (ZOCOR) 20 MG tablet TAKE 1 TABLET BY MOUTH DAILY AT 6 PM. PATIENT NEEDS OFFICE VISIT FOR FURTHER REFILLS 15 tablet 0   memantine (NAMENDA) 5 MG tablet Take 1 tablet (5 mg total) by mouth 2 (two) times daily. 180 tablet 1   No facility-administered medications prior to visit.  PAST MEDICAL HISTORY: Past Medical History:  Diagnosis Date   Anxiety    Arthritis    Diverticulitis    in the past   Headache    Hyperlipidemia    Hypothyroidism    Thyroid disease     PAST SURGICAL HISTORY: Past Surgical History:  Procedure Laterality Date   APPENDECTOMY     CHOLECYSTECTOMY     TOTAL KNEE ARTHROPLASTY Right 09/15/2017   Procedure: RIGHT TOTAL KNEE ARTHROPLASTY;  Surgeon: Beverely Low, MD;  Location: [facility] OR;  Service: Orthopedics;  Laterality: Right;   TUBAL LIGATION     VARICOSE VEIN SURGERY      FAMILY HISTORY: Family History  Problem Relation Age of Onset   Diabetes Mother    Alzheimer's disease Mother    Prostate cancer Father    Alcohol abuse Father    Diabetes Father    Breast cancer Sister    Alzheimer's disease Sibling    Alzheimer's disease Sibling    Colon cancer  Neg Hx     SOCIAL HISTORY: Social History   Socioeconomic History   Marital status: Married    Spouse name: Not on file   Number of children: 3   Years of education: 13   Highest education level: Not on file  Occupational History   Not on file  Tobacco Use   Smoking status: Former    Packs/day: 0.25    Years: 2.00    Pack years: 0.50    Types: Cigarettes    Quit date: 1978    Years since quitting: 44.9   Smokeless tobacco: Never  Vaping Use   Vaping Use: Never used  Substance and Sexual Activity   Alcohol use: Yes    Alcohol/week: 1.0 standard drink    Types: 1 Glasses of wine per week   Drug use: Never   Sexual activity: Yes    Birth control/protection: Surgical  Other Topics Concern   Not on file  Social History Narrative   Lives at home with spouse   Right handed   Caffeine: 2 cups coffee/day   Social Determinants of Health   Financial Resource Strain: Not on file  Food Insecurity: Not on file  Transportation Needs: Not on file  Physical Activity: Not on file  Stress: Not on file  Social Connections: Not on file  Intimate Partner Violence: Not on file      PHYSICAL EXAM  Vitals:   04/06/21 1051  BP: 115/73  Pulse: 60  Weight: 137 lb 6.4 oz (62.3 kg)     Body mass index is 25.96 kg/m.  Generalized: Well developed, in no acute distress  Cardiology: normal rate and rhythm, no murmur noted Respiratory: clear to auscultation bilaterally  Neurological examination  Mentation: Alert oriented to time, place, history taking. Follows all commands speech and language fluent Cranial nerve II-XII: Pupils were equal round reactive to light. Extraocular movements were full, visual field were full on confrontational test. Facial sensation and strength were normal.  Head turning and shoulder shrug  were normal and symmetric. Motor: The motor testing reveals 5 over 5 strength of all 4 extremities. Good symmetric motor tone is noted throughout.  Sensory: Sensory  testing is intact to soft touch on all 4 extremities. No evidence of extinction is noted.  Coordination: Cerebellar testing reveals good finger-nose-finger and heel-to-shin bilaterally.  Gait and station: Gait is normal.   DIAGNOSTIC DATA (LABS, IMAGING, TESTING) - I reviewed patient records, labs, notes, testing and imaging myself where available.  MMSE - Mini Mental State Exam 11/25/2020 05/26/2020 10/01/2019  Orientation to time 3 5 3   Orientation to Place 5 5 4   Registration 3 3 3   Attention/ Calculation 5 5 5   Recall 2 3 3   Language- name 2 objects 2 2 2   Language- repeat 1 1 1   Language- follow 3 step command 3 3 3   Language- read & follow direction 1 1 1   Write a sentence 1 1 1   Copy design 1 1 1   Total score 27 30 27      Lab Results  Component Value Date   WBC 8.4 09/18/2017   HGB 9.5 (L) 09/18/2017   HCT 29.3 (L) 09/18/2017   MCV 90.2 09/18/2017   PLT 195 09/18/2017      Component Value Date/Time   NA 142 05/14/2019 0953   K 4.4 05/14/2019 0953   CL 104 05/14/2019 0953   CL 103 09/16/2015 0000   CO2 24 05/14/2019 0953   GLUCOSE 94 05/14/2019 0953   GLUCOSE 114 (H) 09/16/2017 0410   BUN 18 05/14/2019 0953   CREATININE 0.79 05/14/2019 0953   CALCIUM 9.5 05/14/2019 0953   CALCIUM 9.6 09/16/2015 0000   PROT 7.7 10/20/2016 0923   ALBUMIN 5.0 (H) 10/20/2016 0923   AST 20 10/20/2016 0923   ALT 12 10/20/2016 0923   ALKPHOS 57 10/20/2016 0923   BILITOT 0.4 10/20/2016 0923   GFRNONAA 78 05/14/2019 0953   GFRNONAA 65 09/16/2015 0000   GFRAA 90 05/14/2019 0953   Lab Results  Component Value Date   CHOL 275 (H) 10/20/2016   HDL 111 10/20/2016   LDLCALC 147 (H) 10/20/2016   TRIG 85 10/20/2016   CHOLHDL 2.5 10/20/2016   Lab Results  Component Value Date   HGBA1C 5.6 10/20/2016   Lab Results  Component Value Date   VITAMINB12 1,366 (H) 05/14/2019   Lab Results  Component Value Date   TSH 0.621 01/24/2017       ASSESSMENT AND PLAN 70 y.o. year old  female  has a past medical history of Anxiety, Arthritis, Diverticulitis, Headache, Hyperlipidemia, Hypothyroidism, and Thyroid disease. here with     ICD-10-CM   1. Amnestic MCI (mild cognitive impairment with memory loss)  G31.84     2. Depression with anxiety  F41.8       Kayah feels that memory is fairly stable today. MMSE 27/30. I will have her continue donepezil 10mg  daily and escitalopram 5mg  daily. I will add memantine 5mg  twice daily and plan to titrate to 10mg  BID pending tolerability. She was encouraged to stay mentally and physically active. Healthy lifestyle habits encouraged.  She will see me in 3-4 months, sooner if needed.  She verbalizes understanding and agreement with this plan.   No orders of the defined types were placed in this encounter.    Meds ordered this encounter  Medications   memantine (NAMENDA) 5 MG tablet    Sig: Take 1 tablet (5 mg total) by mouth 2 (two) times daily.    Dispense:  180 tablet    Refill:  1    Order Specific Question:   Supervising Provider    Answer:   05/16/2019, FNP-C 04/06/2021, 11:23 AM [facility] [address], [address] [place], 10/22/2016 10/22/2016 [phone]

## 2021-04-05 NOTE — Patient Instructions (Addendum)
Below is our plan:  We will continue donepezil 10mg  daily and escitalopram 5mg  daily. We will start memantine 5mg  daily x 1 week then increase 5mg  twice daily. If well tolerated in 4 weeks, let me know and we will increase dose to 10mg  twice daily.   Please make sure you are staying well hydrated. I recommend 50-60 ounces daily. Well balanced diet and regular exercise encouraged. Consistent sleep schedule with 6-8 hours recommended.   Please continue follow up with care team as directed.   Follow up with me in 3-4 months   You may receive a survey regarding today's visit. I encourage you to leave honest feed back as I do use this information to improve patient care. Thank you for seeing me today!

## 2021-04-06 ENCOUNTER — Telehealth: Payer: Self-pay | Admitting: Family Medicine

## 2021-04-06 ENCOUNTER — Ambulatory Visit: Payer: PPO | Admitting: Family Medicine

## 2021-04-06 ENCOUNTER — Encounter: Payer: Self-pay | Admitting: Family Medicine

## 2021-04-06 VITALS — BP 115/73 | HR 60 | Wt 137.4 lb

## 2021-04-06 DIAGNOSIS — G3184 Mild cognitive impairment, so stated: Secondary | ICD-10-CM | POA: Diagnosis not present

## 2021-04-06 DIAGNOSIS — F418 Other specified anxiety disorders: Secondary | ICD-10-CM | POA: Diagnosis not present

## 2021-04-06 MED ORDER — MEMANTINE HCL 5 MG PO TABS: 5.0000 mg | ORAL_TABLET | Freq: Two times a day (BID) | ORAL | 1 refills | Status: DC

## 2021-04-06 NOTE — Telephone Encounter (Signed)
Pt's daughter, Elder Love (on [place]) called. She is taking memantine (NAMENDA) 5 MG tablet. But not as instructed. Would like a call from the nurse to explain.

## 2021-04-06 NOTE — Telephone Encounter (Signed)
Called the daughter back. She confirmed that the patient has been taking the namenda 5 mg but only once at bedtime. Advised that she can increase to the 5 mg twice a day. Advised typically we would like the patient to try this dose for at least a month and if tolerates well then we would increase. Pt's daughter verbalized understanding. She will contact us after a month to let us know if patient is doing well and we can correct the script at that time.

## 2021-05-10 ENCOUNTER — Telehealth: Payer: Self-pay | Admitting: Family Medicine

## 2021-05-10 DIAGNOSIS — R413 Other amnesia: Secondary | ICD-10-CM

## 2021-05-10 MED ORDER — MEMANTINE HCL 10 MG PO TABS: 10.0000 mg | ORAL_TABLET | Freq: Two times a day (BID) | ORAL | 1 refills | Status: DC

## 2021-05-10 NOTE — Telephone Encounter (Signed)
Called husband back. Aware we will call in rx Namenda 10mg  po BID for wife. She will use up 5mg  tablet, taking 2 tabs po BID. E-scribed rx to [facility]. He will call back if they have any other questions moving forward.

## 2021-05-10 NOTE — Telephone Encounter (Signed)
Pt's husband has called to report that pt has been able to handle the twice a [id number]:FUXNATFTD (NAMENDA) 5 MG tablet.  Husband has called back to inform he believes pt will be able to handle the 10 mg twice a day memantine [facility]).  Please call husband to discuss.

## 2021-06-18 DIAGNOSIS — E559 Vitamin D deficiency, unspecified: Secondary | ICD-10-CM | POA: Diagnosis not present

## 2021-06-18 DIAGNOSIS — I2510 Atherosclerotic heart disease of native coronary artery without angina pectoris: Secondary | ICD-10-CM | POA: Diagnosis not present

## 2021-06-18 DIAGNOSIS — Z0000 Encounter for general adult medical examination without abnormal findings: Secondary | ICD-10-CM | POA: Diagnosis not present

## 2021-06-18 DIAGNOSIS — N1831 Chronic kidney disease, stage 3a: Secondary | ICD-10-CM | POA: Diagnosis not present

## 2021-06-18 DIAGNOSIS — Z1231 Encounter for screening mammogram for malignant neoplasm of breast: Secondary | ICD-10-CM | POA: Diagnosis not present

## 2021-06-18 DIAGNOSIS — M549 Dorsalgia, unspecified: Secondary | ICD-10-CM | POA: Diagnosis not present

## 2021-06-18 DIAGNOSIS — E7800 Pure hypercholesterolemia, unspecified: Secondary | ICD-10-CM | POA: Diagnosis not present

## 2021-06-18 DIAGNOSIS — G3184 Mild cognitive impairment, so stated: Secondary | ICD-10-CM | POA: Diagnosis not present

## 2021-06-18 DIAGNOSIS — G8929 Other chronic pain: Secondary | ICD-10-CM | POA: Diagnosis not present

## 2021-06-18 DIAGNOSIS — M25569 Pain in unspecified knee: Secondary | ICD-10-CM | POA: Diagnosis not present

## 2021-06-18 DIAGNOSIS — F321 Major depressive disorder, single episode, moderate: Secondary | ICD-10-CM | POA: Diagnosis not present

## 2021-06-18 DIAGNOSIS — E039 Hypothyroidism, unspecified: Secondary | ICD-10-CM | POA: Diagnosis not present

## 2021-08-05 ENCOUNTER — Other Ambulatory Visit: Payer: Self-pay

## 2021-08-05 DIAGNOSIS — G3184 Mild cognitive impairment, so stated: Secondary | ICD-10-CM

## 2021-08-05 MED ORDER — DONEPEZIL HCL 10 MG PO TABS: 10.0000 mg | ORAL_TABLET | Freq: Every day | ORAL | 0 refills | Status: DC

## 2021-08-17 DIAGNOSIS — E039 Hypothyroidism, unspecified: Secondary | ICD-10-CM | POA: Diagnosis not present

## 2021-08-18 NOTE — Patient Instructions (Incomplete)
Below is our plan: ? ?We will *** ? ?Please make sure you are staying well hydrated. I recommend 50-60 ounces daily. Well balanced diet and regular exercise encouraged. Consistent sleep schedule with 6-8 hours recommended.  ? ?Please continue follow up with care team as directed.  ? ?Follow up with *** in *** ? ?You may receive a survey regarding today's visit. I encourage you to leave honest feed back as I do use this information to improve patient care. Thank you for seeing me today!  ? ?Management of Memory Problems ?  ?There are some general things you can do to help manage your memory problems.  Your memory may not in fact recover, but by using techniques and strategies you will be able to manage your memory difficulties better. ?  ?1)  Establish a routine. ?Try to establish and then stick to a regular routine.  By doing this, you will get used to what to expect and you will reduce the need to rely on your memory.  Also, try to do things at the same time of day, such as taking your medication or checking your calendar first thing in the morning. ?Think about think that you can do as a part of a regular routine and make a list.  Then enter them into a daily planner to remind you.  This will help you establish a routine. ?  ?2)  Organize your environment. ?Organize your environment so that it is uncluttered.  Decrease visual stimulation.  Place everyday items such as keys or cell phone in the same place every day (ie.  Basket next to front door) ?Use post it notes with a brief message to yourself (ie. Turn off light, lock the door) ?Use labels to indicate where things go (ie. Which cupboards are for food, dishes, etc.) ?Keep a notepad and pen by the telephone to take messages ?  ?3)  Memory Aids ?A diary or journal/notebook/daily planner ?Making a list (shopping list, chore list, to do list that needs to be done) ?Using an alarm as a reminder (kitchen timer or cell phone alarm) ?Using cell phone to store  information (Notes, Calendar, Reminders) ?Calendar/White board placed in a prominent position ?Post-it notes ?  ?In order for memory aids to be useful, you need to have good habits.  It's no good remembering to make a note in your journal if you don't remember to look in it.  Try setting aside a certain time of day to look in journal. ?  ?4)  Improving mood and managing fatigue. ?There may be other factors that contribute to memory difficulties.  Factors, such as anxiety, depression and tiredness can affect memory. ?Regular gentle exercise can help improve your mood and give you more energy. ?Simple relaxation techniques may help relieve symptoms of anxiety ?Try to get back to completing activities or hobbies you enjoyed doing in the past. ?Learn to pace yourself through activities to decrease fatigue. ?Find out about some local support groups where you can share experiences with others. ?Try and achieve 7-8 hours of sleep at night. ? ?

## 2021-08-18 NOTE — Progress Notes (Deleted)
PATIENT: Amanda Solis DOB: Jun 01, 1950  REASON FOR VISIT: follow up HISTORY FROM: patient  No chief complaint on file.   HISTORY OF PRESENT ILLNESS:  08/19/2021 ALL: Amanda Solis returns for follow up for MCI. She was last seen 03/2021 and continued donepezil and escitalopram. We added memantine 5mg  BID and titrated to 10mg  BID.    04/06/2021 ALL: Amanda Solis returns for follow up for MCI. We continued donepezil 10mg  and escitalopram 5mg  QD and added memantine 5mg  BID at last visit 11/2020. Since, She is doing about the same. No specific changes. She did not start memantine. She is not sure why. She would like to start now. She continues to volunteer with [employer]. She goes to to the [facility] twice weekly. She is driving without difficulty.   11/25/2020 ALL:  Amanda Solis returns for follow up for MCI. We increased donepezil to 10mg  daily and continued escitalopram 5mg  at last visit. She has tolerated medicaitons well. She feels she is doing ok. No significant changes. Her husband feels she continues to have difficulty with short term memory loss. She has a hard time remembering where she placed items around the house. No difficulty managing medicaitons or driving.   05/26/2020 ALL:  She returns today for follow up for MCI. She continues Aricept 5mg  daily. We added escitalopram 5mg  at last visit due to concerns of anxiety. She is uncertain if she has continued this medication. No obvious side effects that she can remember. She presents with her husband, today, who aids in history. They report that memory is fairly stable. Still having difficulty with short term memory. She continues to perform ADLs independently. Able to assist with managing home, finances and grocery shopping. Driving without difficulty. She walks with her daughter two days a week and goes to water aerobics regularly. She was working with PT for right knee pain but recently discharged for meeting goals.    10/01/2019 ALL:  Amanda Schauss  Solis is a 71 y.o. female here today for follow up for memory loss. She continues Aricept. She is not sure it has helped much. She is tolerating well. Neurocognitive testing showed MCI. She does have a strong family history of dementia. She is able to perform ADL's independently. She drives without difficulty. She is able to manage finances. She does endorse anxiety. She has chronic back pain. She is considering surgery but is worried about if it is the right thing for her. She is seeking a second opinion. She walks with her daughter, regularly. She volunteers at [employer] every Monday. She plays games on her phone often.    HISTORY: (copied from Dr Cathren Laine note on 05/14/2019)  HPI:  Amanda Solis is a 71 y.o. female here as requested by Lois Huxley, PA for memory changes with a strong family history of Alzheimer's disease.  She has a past medical history of thyroid disease/hypothyroidism, hyperlipidemia, headache, chronic low back pain and spinal stenosis (Dr. Ellene Route), anxiety, insomnia, arthritis, anxiety, insomnia, family history of Alzheimer's disease.  I reviewed Madolyn Frieze notes, patient complaining of recent decline in memory, her thyroid had continued to remain low despite reductions in dosing of levothyroxine and she was referred to endocrinologist, due to reduced dose medication and her TSH remaining high and concerns with memory decline she was sent to her hormonal specialist in [place] who has been prescribing her multiple hormones and treatments over the last 6 months, also managing her thyroid levels,, unfortunately the patient has not seen any improvement  in fatigue or memory decline.  Per notes CBC, CMP, TSH, vitamin D, vitamin B12 and lipid panel all were essentially normal (TSH 0.88) labs drawn March 05, 2019.  I did review these labs and agree that they were essentially normal except for vitamin B12 at 219 which can still signify B12 deficiency and we will  have to retest that along with methylmalonic acid.  On review of multiple other notes, patient complaining of memory declined, often forgetting things, short-term and longer term, she misplaces items, worried about Alzheimer's given her family history, she also has a history of anxiety on Lexapro, hot flashes has resolved, insomnia, she also has chronic pain severe stenosis L4-L5 and receiving injections with Dr. Ellene Route and declined surgery.  I also reviewed several years of epic notes for more history and memory loss which I did not find.  Also did not find anything in care everywhere.  No imaging of the brain as far as I can see.   Here with her husband who also provides information. Started a year ago slowly with forgetting things, repeating things, they saw a metabolic specialist in [place] and they were referred here. She loses her phone quite often. No problems driving, she was lost once and is not driving as much, she does all the accounting of the bills and not missing bills, she checks everything thoroughly and keeps tracks of credit cards, keeps a spreadsheet very detailed, not missing things, she manages her own medication. She feels worse since Covid, more isolated, she is worried about Covid, she has chronic low back pain, she is worried about anesthesia and will not get surgery on her back, her sister Mechele Claude is oldest sister close to 82 in [place] and within the last few years but unclear if diagnosed with dementia, her brother had dementia in a nursing home unclear, mother had dementia totally didn;t know people or talk but unknown type of dementia. SHe maybe feels her knee surgery 2 years ago may have started her symptoms, now she has chronic pain. Long-term is fine, short-term is more affected. Denies depression, she has some anxiety. She appears to have mood changes, personality changes, more emotional, no hallunications or delusions.    Reviewed notes, labs and imaging from outside  physicians, which showed: see above   REVIEW OF SYSTEMS: Out of a complete 14 system review of symptoms, the patient complains only of the following symptoms, memory loss, anxiety, chronic pain, and all other reviewed systems are negative.  ALLERGIES: No Known Allergies  HOME MEDICATIONS: Outpatient Medications Prior to Visit  Medication Sig Dispense Refill   Ascorbic Acid (VITAMIN C PO) Take 500 mg by mouth daily.     Cholecalciferol (VITAMIN D3 PO) Take 50 mcg by mouth daily.     Cyanocobalamin (VITAMIN B-12 PO) Take 2,500 mcg by mouth daily.     donepezil (ARICEPT) 10 MG tablet Take 1 tablet (10 mg total) by mouth at bedtime. Please keep upcoming appt for continued refills 90 tablet 0   escitalopram (LEXAPRO) 5 MG tablet Take 1 tablet (5 mg total) by mouth daily. 90 tablet 1   levothyroxine (SYNTHROID) 75 MCG tablet Take 75 mcg by mouth daily.     memantine (NAMENDA) 10 MG tablet Take 1 tablet (10 mg total) by mouth 2 (two) times daily. 180 tablet 1   simvastatin (ZOCOR) 20 MG tablet TAKE 1 TABLET BY MOUTH DAILY AT 6 PM. PATIENT NEEDS OFFICE VISIT FOR FURTHER REFILLS 15 tablet 0   No facility-administered  medications prior to visit.    PAST MEDICAL HISTORY: Past Medical History:  Diagnosis Date   Anxiety    Arthritis    Diverticulitis    in the past   Headache    Hyperlipidemia    Hypothyroidism    Thyroid disease     PAST SURGICAL HISTORY: Past Surgical History:  Procedure Laterality Date   APPENDECTOMY     CHOLECYSTECTOMY     TOTAL KNEE ARTHROPLASTY Right 09/15/2017   Procedure: RIGHT TOTAL KNEE ARTHROPLASTY;  Surgeon: Beverely Low, MD;  Location[facility];  Service: Orthopedics;  Laterality: Right;   TUBAL LIGATION     VARICOSE VEIN SURGERY      FAMILY HISTORY: Family History  Problem Relation Age of Onset   Diabetes Mother    Alzheimer's disease Mother    Prostate cancer Father    Alcohol abuse Father    Diabetes Father    Breast cancer Sister     Alzheimer's disease Sibling    Alzheimer's disease Sibling    Colon cancer Neg Hx     SOCIAL HISTORY: Social History   Socioeconomic History   Marital status: Married    Spouse name: Not on file   Number of children: 3   Years of education: 13   Highest education level: Not on file  Occupational History   Not on file  Tobacco Use   Smoking status: Former    Packs/day: 0.25    Years: 2.00    Pack years: 0.50    Types: Cigarettes    Quit date: 1978    Years since quitting: 45.3   Smokeless tobacco: Never  Vaping Use   Vaping Use: Never used  Substance and Sexual Activity   Alcohol use: Yes    Alcohol/week: 1.0 standard drink    Types: 1 Glasses of wine per week   Drug use: Never   Sexual activity: Yes    Birth control/protection: Surgical  Other Topics Concern   Not on file  Social History Narrative   Lives at home with spouse   Right handed   Caffeine: 2 cups coffee/day   Social Determinants of Health   Financial Resource Strain: Not on file  Food Insecurity: Not on file  Transportation Needs: Not on file  Physical Activity: Not on file  Stress: Not on file  Social Connections: Not on file  Intimate Partner Violence: Not on file      PHYSICAL EXAM  There were no vitals filed for this visit.    There is no height or weight on file to calculate BMI.  Generalized: Well developed, in no acute distress  Cardiology: normal rate and rhythm, no murmur noted Respiratory: clear to auscultation bilaterally  Neurological examination  Mentation: Alert oriented to time, place, history taking. Follows all commands speech and language fluent Cranial nerve II-XII: Pupils were equal round reactive to light. Extraocular movements were full, visual field were full on confrontational test. Facial sensation and strength were normal.  Head turning and shoulder shrug  were normal and symmetric. Motor: The motor testing reveals 5 over 5 strength of all 4 extremities. Good  symmetric motor tone is noted throughout.  Sensory: Sensory testing is intact to soft touch on all 4 extremities. No evidence of extinction is noted.  Coordination: Cerebellar testing reveals good finger-nose-finger and heel-to-shin bilaterally.  Gait and station: Gait is normal.   DIAGNOSTIC DATA (LABS, IMAGING, TESTING) - I reviewed patient records, labs, notes, testing and imaging myself where available.  11/25/2020   10:46 AM 05/26/2020   10:56 AM 10/01/2019   10:10 AM  MMSE - Mini Mental State Exam  Orientation to time 3 5 3   Orientation to Place 5 5 4   Registration 3 3 3   Attention/ Calculation 5 5 5   Recall 2 3 3   Language- name 2 objects 2 2 2   Language- repeat 1 1 1   Language- follow 3 step command 3 3 3   Language- read & follow direction 1 1 1   Write a sentence 1 1 1   Copy design 1 1 1   Total score 27 30 27      Lab Results  Component Value Date   WBC 8.4 09/18/2017   HGB 9.5 (L) 09/18/2017   HCT 29.3 (L) 09/18/2017   MCV 90.2 09/18/2017   PLT 195 09/18/2017      Component Value Date/Time   NA 142 05/14/2019 0953   K 4.4 05/14/2019 0953   CL 104 05/14/2019 0953   CL 103 09/16/2015 0000   CO2 24 05/14/2019 0953   GLUCOSE 94 05/14/2019 0953   GLUCOSE 114 (H) 09/16/2017 0410   BUN 18 05/14/2019 0953   CREATININE 0.79 05/14/2019 0953   CALCIUM 9.5 05/14/2019 0953   CALCIUM 9.6 09/16/2015 0000   PROT 7.7 10/20/2016 0923   ALBUMIN 5.0 (H) 10/20/2016 0923   AST 20 10/20/2016 0923   ALT 12 10/20/2016 0923   ALKPHOS 57 10/20/2016 0923   BILITOT 0.4 10/20/2016 0923   GFRNONAA 78 05/14/2019 0953   GFRNONAA 65 09/16/2015 0000   GFRAA 90 05/14/2019 0953   Lab Results  Component Value Date   CHOL 275 (H) 10/20/2016   HDL 111 10/20/2016   LDLCALC 147 (H) 10/20/2016   TRIG 85 10/20/2016   CHOLHDL 2.5 10/20/2016   Lab Results  Component Value Date   HGBA1C 5.6 10/20/2016   Lab Results  Component Value Date   VITAMINB12 1,366 (H) 05/14/2019   Lab Results   Component Value Date   TSH 0.621 01/24/2017       ASSESSMENT AND PLAN 71 y.o. year old female  has a past medical history of Anxiety, Arthritis, Diverticulitis, Headache, Hyperlipidemia, Hypothyroidism, and Thyroid disease. here with   No diagnosis found.   Amanda Solis feels that memory is fairly stable today. MMSE 27/30. I will have her continue donepezil 10mg  daily and escitalopram 5mg  daily. I will add memantine 5mg  twice daily and plan to titrate to 10mg  BID pending tolerability. She was encouraged to stay mentally and physically active. Healthy lifestyle habits encouraged.  She will see me in 3-4 months, sooner if needed.  She verbalizes understanding and agreement with this plan.   No orders of the defined types were placed in this encounter.     No orders of the defined types were placed in this encounter.    Debbora Presto, FNP-C 08/18/2021, 4:39 PM [facility] [address] [place], [place] [address] [phone]

## 2021-08-19 ENCOUNTER — Telehealth: Payer: Self-pay | Admitting: Family Medicine

## 2021-08-19 ENCOUNTER — Ambulatory Visit: Payer: PPO | Admitting: Family Medicine

## 2021-08-19 DIAGNOSIS — G3184 Mild cognitive impairment, so stated: Secondary | ICD-10-CM

## 2021-08-19 DIAGNOSIS — F418 Other specified anxiety disorders: Secondary | ICD-10-CM

## 2021-08-19 NOTE — Telephone Encounter (Signed)
Pt's husband, Jaaliyah Lucatero cancelled pt's appt due to having dizziness and headache. ?

## 2021-08-25 NOTE — Patient Instructions (Signed)
Below is our plan: ? ?We will continue donepezil 10mg  daily and memantine 10mg  twice daily. We will increase escitalopram dose to 10mg  daily. You can double up on the 5mg  tablet until finished then start 10mg  tablet once daily.  ? ?Consider magnesium and riboflavin for headache prevention if needed. Monitor for any concerns of sleep apnea. Try to increased physical exercise.   ? ?Please make sure you are staying well hydrated. I recommend 50-60 ounces daily. Well balanced diet and regular exercise encouraged. Consistent sleep schedule with 6-8 hours recommended.  ? ?Please continue follow up with care team as directed.  ? ?Follow up with me in 4-6 months ? ?You may receive a survey regarding today's visit. I encourage you to leave honest feed back as I do use this information to improve patient care. Thank you for seeing me today!  ? ?Management of Memory Problems ?  ?There are some general things you can do to help manage your memory problems.  Your memory may not in fact recover, but by using techniques and strategies you will be able to manage your memory difficulties better. ?  ?1)  Establish a routine. ?Try to establish and then stick to a regular routine.  By doing this, you will get used to what to expect and you will reduce the need to rely on your memory.  Also, try to do things at the same time of day, such as taking your medication or checking your calendar first thing in the morning. ?Think about think that you can do as a part of a regular routine and make a list.  Then enter them into a daily planner to remind you.  This will help you establish a routine. ?  ?2)  Organize your environment. ?Organize your environment so that it is uncluttered.  Decrease visual stimulation.  Place everyday items such as keys or cell phone in the same place every day (ie.  Basket next to front door) ?Use post it notes with a brief message to yourself (ie. Turn off light, lock the door) ?Use labels to indicate where things  go (ie. Which cupboards are for food, dishes, etc.) ?Keep a notepad and pen by the telephone to take messages ?  ?3)  Memory Aids ?A diary or journal/notebook/daily planner ?Making a list (shopping list, chore list, to do list that needs to be done) ?Using an alarm as a reminder (kitchen timer or cell phone alarm) ?Using cell phone to store information (Notes, Calendar, Reminders) ?Calendar/White board placed in a prominent position ?Post-it notes ?  ?In order for memory aids to be useful, you need to have good habits.  It's no good remembering to make a note in your journal if you don't remember to look in it.  Try setting aside a certain time of day to look in journal. ?  ?4)  Improving mood and managing fatigue. ?There may be other factors that contribute to memory difficulties.  Factors, such as anxiety, depression and tiredness can affect memory. ?Regular gentle exercise can help improve your mood and give you more energy. ?Simple relaxation techniques may help relieve symptoms of anxiety ?Try to get back to completing activities or hobbies you enjoyed doing in the past. ?Learn to pace yourself through activities to decrease fatigue. ?Find out about some local support groups where you can share experiences with others. ?Try and achieve 7-8 hours of sleep at night. ? ?

## 2021-08-25 NOTE — Progress Notes (Signed)
? ? ?PATIENT: Amanda Solis ?DOB: 01-May-1950 ? ?REASON FOR VISIT: follow up ?HISTORY FROM: patient ? ?Chief Complaint  ?Patient presents with  ? Follow-up  ?  Rm 7 with husband here f/u on memory- feels like memory has declined some since last visit.  ? ? ?HISTORY OF PRESENT ILLNESS: ? ?08/19/2021 ALL: ?Amanda Solis returns for follow up for MCI. She was last seen 03/2021 and continued donepezil and escitalopram. We added memantine 5mg  BID and titrated to 10mg  BID.  She has tolerated medicaitons well. Memory seems fairly stable. She has had more trouble with inattention. She is more likely to leave cabinet doors opened or tasks unfinished. Mr Mansour feels she continues to withdraw in social situations. 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 She drives without difficulty. Able to manage medications and perform ADLs independently. She is sleeping well. She does wake with headaches sometimes. She snores occasionally but husband denies apneic events. She has seasonal allergies. BP is usually well managed.  ? ?04/06/2021 ALL: ?Amanda Solis returns for follow up for MCI. We continued donepezil 10mg  and escitalopram 5mg  QD and added memantine 5mg  BID at last visit 11/2020. Since, She is doing about the same. No specific changes. She did not start memantine. She is not sure why. She would like to start now. She continues to volunteer with [employer]. She goes to to the [facility] twice weekly. She is driving without difficulty.  ? ?11/25/2020 ALL:  ?Amanda Solis returns for follow up for MCI. We increased donepezil to 10mg  daily and continued escitalopram 5mg  at last visit. She has tolerated medicaitons well. She feels she is doing ok. No significant changes. Her husband feels she continues to have difficulty with short term memory loss. She has a hard time remembering where she placed items around the house. No difficulty managing medicaitons or driving.  ? ?05/26/2020 ALL:  ?She returns today for follow up  for MCI. She continues Aricept 5mg  daily. We added escitalopram 5mg  at last visit due to concerns of anxiety. She is uncertain if she has continued this medication. No obvious side effects that she can remember. She presents with her husband, today, who aids in history. They report that memory is fairly stable. Still having difficulty with short term memory. She continues to perform ADLs independently. Able to assist with managing home, finances and grocery shopping. Driving without difficulty. She walks with her daughter two days a week and goes to water aerobics regularly. She was working with PT for right knee pain but recently discharged for meeting goals.  ? ? ?10/01/2019 ALL:  ?Amanda Solis is a 71 y.o. female here today for follow up for memory loss. She continues Aricept. She is not sure it has helped much. She is tolerating well. Neurocognitive testing showed MCI. She does have a strong family history of dementia. She is able to perform ADL's independently. She drives without difficulty. She is able to manage finances. She does endorse anxiety. She has chronic back pain. She is considering surgery but is worried about if it is the right thing for her. She is seeking a second opinion. She walks with her daughter, regularly. She volunteers at [employer] every Monday. She plays games on her phone often.  ? ? ?HISTORY: (copied from Dr Cathren Laine note on 05/14/2019) ? ?HPI:  Amanda Solis is a 71 y.o. female here as requested by Lois Huxley, PA for memory changes with a strong family history of Alzheimer's disease.  She  has a past medical history of thyroid disease/hypothyroidism, hyperlipidemia, headache, chronic low back pain and spinal stenosis (Dr. Ellene Route), anxiety, insomnia, arthritis, anxiety, insomnia, family history of Alzheimer's disease.  I reviewed Madolyn Frieze notes, patient complaining of recent decline in memory, her thyroid had continued to remain low despite reductions in dosing  of levothyroxine and she was referred to endocrinologist, due to reduced dose medication and her TSH remaining high and concerns with memory decline she was sent to her hormonal specialist in [place] who has been prescribing her multiple hormones and treatments over the last 6 months, also managing her thyroid levels,, unfortunately the patient has not seen any improvement in fatigue or memory decline.  Per notes CBC, CMP, TSH, vitamin D, vitamin B12 and lipid panel all were essentially normal (TSH 0.88) labs drawn March 05, 2019.  I did review these labs and agree that they were essentially normal except for vitamin B12 at 219 which can still signify B12 deficiency and we will have to retest that along with methylmalonic acid.  On review of multiple other notes, patient complaining of memory declined, often forgetting things, short-term and longer term, she misplaces items, worried about Alzheimer's given her family history, she also has a history of anxiety on Lexapro, hot flashes has resolved, insomnia, she also has chronic pain severe stenosis L4-L5 and receiving injections with Dr. Ellene Route and declined surgery.  I also reviewed several years of epic notes for more history and memory loss which I did not find.  Also did not find anything in care everywhere.  No imaging of the brain as far as I can see. ?  ?Here with her husband who also provides information. Started a year ago slowly with forgetting things, repeating things, they saw a metabolic specialist in [place] and they were referred here. She loses her phone quite often. No problems driving, she was lost once and is not driving as much, she does all the accounting of the bills and not missing bills, she checks everything thoroughly and keeps tracks of credit cards, keeps a spreadsheet very detailed, not missing things, she manages her own medication. She feels worse since Covid, more isolated, she is worried about Covid, she has chronic low  back pain, she is worried about anesthesia and will not get surgery on her back, her sister Mechele Claude is oldest sister close to 51 in [place] and within the last few years but unclear if diagnosed with dementia, her brother had dementia in a nursing home unclear, mother had dementia totally didn;t know people or talk but unknown type of dementia. SHe maybe feels her knee surgery 2 years ago may have started her symptoms, now she has chronic pain. Long-term is fine, short-term is more affected. Denies depression, she has some anxiety. She appears to have mood changes, personality changes, more emotional, no hallunications or delusions.  ?  ?Reviewed notes, labs and imaging from outside physicians, which showed: see above ? ? ?REVIEW OF SYSTEMS: Out of a complete 14 system review of symptoms, the patient complains only of the following symptoms, memory loss, anxiety, chronic pain, and all other reviewed systems are negative. ? ?ALLERGIES: ?No Known Allergies ? ?HOME MEDICATIONS: ?Outpatient Medications Prior to Visit  ?Medication Sig Dispense Refill  ? Ascorbic Acid (VITAMIN C PO) Take 500 mg by mouth daily.    ? Cholecalciferol (VITAMIN D3 PO) Take 50 mcg by mouth daily.    ? Cyanocobalamin (VITAMIN B-12 PO) Take 2,500 mcg by mouth daily.    ?  levothyroxine (SYNTHROID) 75 MCG tablet Take 75 mcg by mouth daily.    ? meloxicam (MOBIC) 7.5 MG tablet Take by mouth.    ? simvastatin (ZOCOR) 20 MG tablet TAKE 1 TABLET BY MOUTH DAILY AT 6 PM. PATIENT NEEDS OFFICE VISIT FOR FURTHER REFILLS 15 tablet 0  ? donepezil (ARICEPT) 10 MG tablet Take 1 tablet (10 mg total) by mouth at bedtime. Please keep upcoming appt for continued refills 90 tablet 0  ? escitalopram (LEXAPRO) 5 MG tablet Take 1 tablet (5 mg total) by mouth daily. 90 tablet 1  ? memantine (NAMENDA) 10 MG tablet Take 1 tablet (10 mg total) by mouth 2 (two) times daily. 180 tablet 1  ? ?No facility-administered medications prior to visit.  ? ? ?PAST MEDICAL  HISTORY: ?Past Medical History:  ?Diagnosis Date  ? Anxiety   ? Arthritis   ? Diverticulitis   ? in the past  ? Headache   ? Hyperlipidemia   ? Hypothyroidism   ? Thyroid disease   ? ? ?PAST SURGICAL HISTORY: ?Past

## 2021-08-26 ENCOUNTER — Telehealth: Payer: Self-pay | Admitting: Family Medicine

## 2021-08-26 ENCOUNTER — Ambulatory Visit: Payer: PPO | Admitting: Family Medicine

## 2021-08-26 ENCOUNTER — Encounter: Payer: Self-pay | Admitting: Family Medicine

## 2021-08-26 VITALS — BP 122/66 | HR 65 | Ht 61.0 in | Wt 132.0 lb

## 2021-08-26 DIAGNOSIS — G3184 Mild cognitive impairment, so stated: Secondary | ICD-10-CM | POA: Diagnosis not present

## 2021-08-26 DIAGNOSIS — F418 Other specified anxiety disorders: Secondary | ICD-10-CM

## 2021-08-26 DIAGNOSIS — R413 Other amnesia: Secondary | ICD-10-CM

## 2021-08-26 MED ORDER — DONEPEZIL HCL 10 MG PO TABS: 10.0000 mg | ORAL_TABLET | Freq: Every day | ORAL | 1 refills | Status: DC

## 2021-08-26 MED ORDER — ESCITALOPRAM OXALATE 10 MG PO TABS: 5.0000 mg | ORAL_TABLET | Freq: Every day | ORAL | 1 refills | Status: DC

## 2021-08-26 MED ORDER — MEMANTINE HCL 10 MG PO TABS: 10.0000 mg | ORAL_TABLET | Freq: Two times a day (BID) | ORAL | 1 refills | Status: DC

## 2021-08-26 MED ORDER — ESCITALOPRAM OXALATE 10 MG PO TABS: 10.0000 mg | ORAL_TABLET | Freq: Every day | ORAL | 1 refills | Status: DC

## 2021-08-26 NOTE — Telephone Encounter (Signed)
Called back. Confirmed dose should be 10mg  po qd per Amy's last note. I sent in corrected prescription to [facility]. He verbalized understanding.  ?

## 2021-08-26 NOTE — Telephone Encounter (Signed)
Pt husband (on DPR) calling concerning new dosage of ? escitalopram (LEXAPRO) 10 MG tablet ?States the new dosage should be 10mg  a day and the new prescription bottle of medication states to take 0.5 tablets (5mg  total) daily.  ?Pt would like to clarify that she is supposed to now be taking 10mg .  ?Would like a call back.  ? ? ? ? ? ? ? ? ?

## 2021-12-21 DIAGNOSIS — G3184 Mild cognitive impairment, so stated: Secondary | ICD-10-CM | POA: Diagnosis not present

## 2021-12-21 DIAGNOSIS — E7800 Pure hypercholesterolemia, unspecified: Secondary | ICD-10-CM | POA: Diagnosis not present

## 2021-12-21 DIAGNOSIS — E039 Hypothyroidism, unspecified: Secondary | ICD-10-CM | POA: Diagnosis not present

## 2021-12-21 DIAGNOSIS — I2510 Atherosclerotic heart disease of native coronary artery without angina pectoris: Secondary | ICD-10-CM | POA: Diagnosis not present

## 2021-12-21 DIAGNOSIS — N1831 Chronic kidney disease, stage 3a: Secondary | ICD-10-CM | POA: Diagnosis not present

## 2021-12-21 DIAGNOSIS — F321 Major depressive disorder, single episode, moderate: Secondary | ICD-10-CM | POA: Diagnosis not present

## 2021-12-21 DIAGNOSIS — E559 Vitamin D deficiency, unspecified: Secondary | ICD-10-CM | POA: Diagnosis not present

## 2022-02-01 DIAGNOSIS — R946 Abnormal results of thyroid function studies: Secondary | ICD-10-CM | POA: Diagnosis not present

## 2022-02-01 DIAGNOSIS — D649 Anemia, unspecified: Secondary | ICD-10-CM | POA: Diagnosis not present

## 2022-03-09 NOTE — Patient Instructions (Incomplete)
Below is our plan:  We will continue donepezil 10mg  daily, memantine 10mg  twice daily and escitalopram 10mg  daily. We could consider speech therapy for cognitive exercises. We couls consider formal neurocognitive testing. You could consider talking with a counselor.   Please make sure you are staying well hydrated. I recommend 50-60 ounces daily. Well balanced diet and regular exercise encouraged. Consistent sleep schedule with 6-8 hours recommended.   Please continue follow up with care team as directed.   Follow up with me in 1 year  You may receive a survey regarding today's visit. I encourage you to leave honest feed back as I do use this information to improve patient care. Thank you for seeing me today!    Management of Memory Problems   There are some general things you can do to help manage your memory problems.  Your memory may not in fact recover, but by using techniques and strategies you will be able to manage your memory difficulties better.   1)  Establish a routine. Try to establish and then stick to a regular routine.  By doing this, you will get used to what to expect and you will reduce the need to rely on your memory.  Also, try to do things at the same time of day, such as taking your medication or checking your calendar first thing in the morning. Think about think that you can do as a part of a regular routine and make a list.  Then enter them into a daily planner to remind you.  This will help you establish a routine.   2)  Organize your environment. Organize your environment so that it is uncluttered.  Decrease visual stimulation.  Place everyday items such as keys or cell phone in the same place every day (ie.  Basket next to front door) Use post it notes with a brief message to yourself (ie. Turn off light, lock the door) Use labels to indicate where things go (ie. Which cupboards are for food, dishes, etc.) Keep a notepad and pen by the telephone to take messages    3)  Memory Aids A diary or journal/notebook/daily planner Making a list (shopping list, chore list, to do list that needs to be done) Using an alarm as a reminder (kitchen timer or cell phone alarm) Using cell phone to store information (Notes, Calendar, Reminders) Calendar/White board placed in a prominent position Post-it notes   In order for memory aids to be useful, you need to have good habits.  It's no good remembering to make a note in your journal if you don't remember to look in it.  Try setting aside a certain time of day to look in journal.   4)  Improving mood and managing fatigue. There may be other factors that contribute to memory difficulties.  Factors, such as anxiety, depression and tiredness can affect memory. Regular gentle exercise can help improve your mood and give you more energy. Exercise: there are short videos created by the on Health specially for older adults: https://bit.ly/2I30q97.  Mediterranean diet: which emphasizes fruits, vegetables, whole grains, legumes, fish, and other seafood; unsaturated fats such as olive oils; and low amounts of red meat, eggs, and sweets. A variation of this, called MIND (Mediterranean-DASH Intervention for Neurodegenerative Delay) incorporates the DASH (Dietary Approaches to Stop Hypertension) diet, which has been shown to lower high blood pressure, a risk factor for Alzheimer's disease. More information at: .  Aerobic exercise that improve heart health is also  good for the mind.  [employer] on Aging have short videos for exercises that you can do at home: GoldCloset.[employer].ee Simple relaxation techniques may help relieve symptoms of anxiety Try to get back to completing activities or hobbies you enjoyed doing in the past. Learn to pace yourself through activities to decrease fatigue. Find out about some local  support groups where you can share experiences with others. Try and achieve 7-8 hours of sleep at night.

## 2022-03-09 NOTE — Progress Notes (Unsigned)
PATIENT: Amanda Solis DOB: [birth year]  REASON FOR VISIT: follow up HISTORY FROM: patient  No chief complaint on file.   HISTORY OF PRESENT ILLNESS:  08/19/2021 ALL: Amanda Solis returns for follow up for MCI. She was last seen 08/2021. We continued donepezil and memantine and increased escitalopram to  daily. Since,   08/26/2021 ALL: Amanda Solis returns for follow up for MCI. She was last seen 03/2021 and continued donepezil and escitalopram. We added memantine  BID and titrated to  BID.  She has tolerated medicaitons well. Memory seems fairly stable. She has had more trouble with inattention. She is more likely to leave cabinet doors opened or tasks unfinished. Mr Werden feels she continues to withdraw in social situations. 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 She drives without difficulty. Able to manage medications and perform ADLs independently. She is sleeping well. She does wake with headaches sometimes. She snores occasionally but husband denies apneic events. She has seasonal allergies. BP is usually well managed.   04/06/2021 ALL: Amanda Solis returns for follow up for MCI. We continued donepezil  and escitalopram  QD and added memantine  BID at last visit 11/2020. Since, She is doing about the same. No specific changes. She did not start memantine. She is not sure why. She would like to start now. She continues to volunteer with [employer]. She goes to to the [facility] twice weekly. She is driving without difficulty.   11/25/2020 ALL:  Amanda Solis returns for follow up for MCI. We increased donepezil to  daily and continued escitalopram  at last visit. She has tolerated medicaitons well. She feels she is doing ok. No significant changes. Her husband feels she continues to have difficulty with short term memory loss. She has a hard time remembering where she placed items around the house. No difficulty managing medicaitons or driving.    05/26/2020 ALL:  She returns today for follow up for MCI. She continues Aricept  daily. We added escitalopram  at last visit due to concerns of anxiety. She is uncertain if she has continued this medication. No obvious side effects that she can remember. She presents with her husband, today, who aids in history. They report that memory is fairly stable. Still having difficulty with short term memory. She continues to perform ADLs independently. Able to assist with managing home, finances and grocery shopping. Driving without difficulty. She walks with her daughter two days a week and goes to water aerobics regularly. She was working with PT for right knee pain but recently discharged for meeting goals.    10/01/2019 ALL:  Amanda Solis is a 71 y.o. female here today for follow up for memory loss. She continues Aricept. She is not sure it has helped much. She is tolerating well. Neurocognitive testing showed MCI. She does have a strong family history of dementia. She is able to perform ADL's independently. She drives without difficulty. She is able to manage finances. She does endorse anxiety. She has chronic back pain. She is considering surgery but is worried about if it is the right thing for her. She is seeking a second opinion. She walks with her daughter, regularly. She volunteers at [employer] every Monday. She plays games on her phone often.    HISTORY: (copied from [name] note on 05/14/2019)  HPI:  Amanda Solis is a 71 y.o. female here as requested by [name], PA for memory changes with a strong family history of Alzheimer's  disease.  She has a past medical history of thyroid disease/hypothyroidism, hyperlipidemia, headache, chronic low back pain and spinal stenosis ([name]), anxiety, insomnia, arthritis, anxiety, insomnia, family history of Alzheimer's disease.  I reviewed [name], patient complaining of recent decline in memory, her thyroid had  continued to remain low despite reductions in dosing of levothyroxine and she was referred to endocrinologist, due to reduced dose medication and her TSH remaining high and concerns with memory decline she was sent to her hormonal specialist in [place] who has been prescribing her multiple hormones and treatments over the last 6 months, also managing her thyroid levels,, unfortunately the patient has not seen any improvement in fatigue or memory decline.  Per notes CBC, CMP, TSH, vitamin D, vitamin B12 and lipid panel all were essentially normal (TSH 0.88) labs drawn March 05, 2019.  I did review these labs and agree that they were essentially normal except for vitamin B12 at 219 which can still signify B12 deficiency and we will have to retest that along with methylmalonic acid.  On review of multiple other notes, patient complaining of memory declined, often forgetting things, short-term and longer term, she misplaces items, worried about Alzheimer's given her family history, she also has a history of anxiety on Lexapro, hot flashes has resolved, insomnia, she also has chronic pain severe stenosis L4-L5 and receiving injections with [name] and declined surgery.  I also reviewed several years of epic notes for more history and memory loss which I did not find.  Also did not find anything in care everywhere.  No imaging of the brain as far as I can see.   Here with her husband who also provides information. Started a year ago slowly with forgetting things, repeating things, they saw a metabolic specialist in [place] and they were referred here. She loses her phone quite often. No problems driving, she was lost once and is not driving as much, she does all the accounting of the bills and not missing bills, she checks everything thoroughly and keeps tracks of credit cards, keeps a spreadsheet very detailed, not missing things, she manages her own medication. She feels worse since Covid, more  isolated, she is worried about Covid, she has chronic low back pain, she is worried about anesthesia and will not get surgery on her back, [name] is oldest sister close to 50 in [place] and within the last few years but unclear if diagnosed with dementia, her brother had dementia in a nursing home unclear, mother had dementia totally didn;t know people or talk but unknown type of dementia. SHe maybe feels her knee surgery 2 years ago may have started her symptoms, now she has chronic pain. Long-term is fine, short-term is more affected. Denies depression, she has some anxiety. She appears to have mood changes, personality changes, more emotional, no hallunications or delusions.    Reviewed notes, labs and imaging from outside physicians, which showed: see above   REVIEW OF SYSTEMS: Out of a complete 14 system review of symptoms, the patient complains only of the following symptoms, memory loss, anxiety, chronic pain, and all other reviewed systems are negative.  ALLERGIES: No Known Allergies  HOME MEDICATIONS: Outpatient Medications Prior to Visit  Medication Sig Dispense Refill   Ascorbic Acid (VITAMIN C PO) Take 500 mg by mouth daily.     Cholecalciferol (VITAMIN D3 PO) Take 50 mcg by mouth daily.     Cyanocobalamin (VITAMIN B-12 PO) Take 2,500 mcg by mouth  daily.     donepezil (ARICEPT) 10 MG tablet Take 1 tablet (10 mg total) by mouth at bedtime. 90 tablet 1   escitalopram (LEXAPRO) 10 MG tablet Take 1 tablet (10 mg total) by mouth daily. 90 tablet 1   levothyroxine (SYNTHROID) 75 MCG tablet Take 75 mcg by mouth daily.     meloxicam (MOBIC) 7.5 MG tablet Take by mouth.     memantine (NAMENDA) 10 MG tablet Take 1 tablet (10 mg total) by mouth 2 (two) times daily. 180 tablet 1   simvastatin (ZOCOR) 20 MG tablet TAKE 1 TABLET BY MOUTH DAILY AT 6 PM. PATIENT NEEDS OFFICE VISIT FOR FURTHER REFILLS 15 tablet 0   No facility-administered medications prior to visit.    PAST MEDICAL  HISTORY: Past Medical History:  Diagnosis Date   Anxiety    Arthritis    Diverticulitis    in the past   Headache    Hyperlipidemia    Hypothyroidism    Thyroid disease     PAST SURGICAL HISTORY: Past Surgical History:  Procedure Laterality Date   APPENDECTOMY     CHOLECYSTECTOMY     TOTAL KNEE ARTHROPLASTY Right 09/15/2017   Procedure: RIGHT TOTAL KNEE ARTHROPLASTY;  Surgeon: Beverely LowNorris, Steve, MD;  [facility];  Service: Orthopedics;  Laterality: Right;   TUBAL LIGATION     VARICOSE VEIN SURGERY      FAMILY HISTORY: Family History  Problem Relation Age of Onset   Diabetes Mother    Alzheimer's disease Mother    Prostate cancer Father    Alcohol abuse Father    Diabetes Father    Breast cancer Sister    Alzheimer's disease Sibling    Alzheimer's disease Sibling    Colon cancer Neg Hx     SOCIAL HISTORY: Social History   Socioeconomic History   Marital status: Married    Spouse name: Not on file   Number of children: 3   Years of education: 13   Highest education level: Not on file  Occupational History   Not on file  Tobacco Use   Smoking status: Former    Packs/day: 0.25    Years: 2.00    Total pack years: 0.50    Types: Cigarettes    Quit date: 1978    Years since quitting: 45.9   Smokeless tobacco: Never  Vaping Use   Vaping Use: Never used  Substance and Sexual Activity   Alcohol use: Yes    Alcohol/week: 1.0 standard drink of alcohol    Types: 1 Glasses of wine per week   Drug use: Never   Sexual activity: Yes    Birth control/protection: Surgical  Other Topics Concern   Not on file  Social History Narrative   Lives at home with spouse   Right handed   Caffeine: 2 cups coffee/day   Social Determinants of Health   Financial Resource Strain: Not on file  Food Insecurity: Not on file  Transportation Needs: Not on file  Physical Activity: Not on file  Stress: Not on file  Social Connections: Not on file  Intimate Partner Violence: Not  on file      PHYSICAL EXAM  There were no vitals filed for this visit.     There is no height or weight on file to calculate BMI.  Generalized: Well developed, in no acute distress  Cardiology: normal rate and rhythm, no murmur noted Respiratory: clear to auscultation bilaterally  Neurological examination  Mentation: Alert oriented to time,  place, history taking. Follows all commands speech and language fluent Cranial nerve II-XII: Pupils were equal round reactive to light. Extraocular movements were full, visual field were full on confrontational test. Facial sensation and strength were normal.  Head turning and shoulder shrug  were normal and symmetric. Motor: The motor testing reveals 5 over 5 strength of all 4 extremities. Good symmetric motor tone is noted throughout.  Sensory: Sensory testing is intact to soft touch on all 4 extremities. No evidence of extinction is noted.  Coordination: Cerebellar testing reveals good finger-nose-finger and heel-to-shin bilaterally.  Gait and station: Gait is normal.   DIAGNOSTIC DATA (LABS, IMAGING, TESTING) - I reviewed patient records, labs, notes, testing and imaging myself where available.     08/26/2021   10:22 AM 11/25/2020   10:46 AM 05/26/2020   10:56 AM  MMSE - Mini Mental State Exam  Orientation to time 4 3 5   Orientation to Place 5 5 5   Registration 3 3 3   Attention/ Calculation 5 5 5   Recall 2 2 3   Language- name 2 objects 2 2 2   Language- repeat 1 1 1   Language- follow 3 step command 3 3 3   Language- read & follow direction 1 1 1   Write a sentence 1 1 1   Copy design 1 1 1   Total score 28 27 30      Lab Results  Component Value Date   WBC 8.4 09/18/2017   HGB 9.5 (L) 09/18/2017   HCT 29.3 (L) 09/18/2017   MCV 90.2 09/18/2017   PLT 195 09/18/2017      Component Value Date/Time   NA 142 05/14/2019 0953   K 4.4 05/14/2019 0953   CL 104 05/14/2019 0953   CL 103 09/16/2015 0000   CO2 24 05/14/2019 0953   GLUCOSE  94 05/14/2019 0953   GLUCOSE 114 (H) 09/16/2017 0410   BUN 18 05/14/2019 0953   CREATININE 0.79 05/14/2019 0953   CALCIUM 9.5 05/14/2019 0953   CALCIUM 9.6 09/16/2015 0000   PROT 7.7 10/20/2016 0923   ALBUMIN 5.0 (H) 10/20/2016 0923   AST 20 10/20/2016 0923   ALT 12 10/20/2016 0923   ALKPHOS 57 10/20/2016 0923   BILITOT 0.4 10/20/2016 0923   GFRNONAA 78 05/14/2019 0953   GFRNONAA 65 09/16/2015 0000   GFRAA 90 05/14/2019 0953   Lab Results  Component Value Date   CHOL 275 (H) 10/20/2016   HDL 111 10/20/2016   LDLCALC 147 (H) 10/20/2016   TRIG 85 10/20/2016   CHOLHDL 2.5 10/20/2016   Lab Results  Component Value Date   HGBA1C 5.6 10/20/2016   Lab Results  Component Value Date   VITAMINB12 1,366 (H) 05/14/2019   Lab Results  Component Value Date   TSH 0.621 01/24/2017     ASSESSMENT AND PLAN 71 y.o. year old female  has a past medical history of Anxiety, Arthritis, Diverticulitis, Headache, Hyperlipidemia, Hypothyroidism, and Thyroid disease. here with   No diagnosis found.   Amanda Solis feels that memory is fairly stable today. MMSE 28/30. I will have her continue donepezil 10mg  daily and memantine 10mg  BID. We will increase escitalopram  to 10mg  daily due to continued concerns of being withdrawn and worry. She will monitor closely for any adverse effects. She was encouraged to stay mentally and physically active. Healthy lifestyle habits encouraged.  She will see me in 3-4 months, sooner if needed.  She verbalizes understanding and agreement with this plan.   No orders of the defined types were  placed in this encounter.     No orders of the defined types were placed in this encounter.

## 2022-03-10 ENCOUNTER — Ambulatory Visit: Payer: PPO | Admitting: Family Medicine

## 2022-03-10 ENCOUNTER — Telehealth: Payer: Self-pay | Admitting: Family Medicine

## 2022-03-10 ENCOUNTER — Encounter: Payer: Self-pay | Admitting: Family Medicine

## 2022-03-10 VITALS — BP 129/72 | HR 79 | Ht 61.0 in | Wt 133.0 lb

## 2022-03-10 DIAGNOSIS — F418 Other specified anxiety disorders: Secondary | ICD-10-CM | POA: Diagnosis not present

## 2022-03-10 DIAGNOSIS — R413 Other amnesia: Secondary | ICD-10-CM | POA: Diagnosis not present

## 2022-03-10 DIAGNOSIS — G3184 Mild cognitive impairment, so stated: Secondary | ICD-10-CM

## 2022-03-10 MED ORDER — ESCITALOPRAM OXALATE 10 MG PO TABS: 10.0000 mg | ORAL_TABLET | Freq: Every day | ORAL | 3 refills | Status: DC

## 2022-03-10 MED ORDER — MEMANTINE HCL 10 MG PO TABS: 10.0000 mg | ORAL_TABLET | Freq: Two times a day (BID) | ORAL | 3 refills | Status: DC

## 2022-03-10 MED ORDER — DONEPEZIL HCL 10 MG PO TABS: 10.0000 mg | ORAL_TABLET | Freq: Every day | ORAL | 3 refills | Status: DC

## 2022-03-10 NOTE — Telephone Encounter (Signed)
Daughter asking for a call to discuss additional info re: pt's memory.  Daughter states she is unable to discuss these concerns in presence of pt.

## 2022-03-11 NOTE — Telephone Encounter (Signed)
Called daughter back. They saw Amy Lomax,NP yesterday for appt. There were some things she was not able to share in front of her mother in fear it would upset her.  Daughter states her mother's memory is worsening. Has poor short memory. Long term memory okay. She does not have any interest in cooking (she is able but gets confused easily if following recipe). 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 Others days, daughter or husband drives. Not sure she can drive herself to placed in [place] or other places d/t memory issues. She also fixates on things. Example: someone's license plate expired and she fixates on it.  Abnormal noises, more sensitive to this. They were in loaner vehicle of hers and she kept fixating on abnormal noise in car. Within last couple years, picks at things until they bleed. Example: scabs on body. She also has no awareness of germs. Puts fingers in mouth after going to grocery store.   Aware I will pass along to AL,NP. She is not in office today but  back on Monday. If she recommends anything on top of what they discussed we will f/u. She verbalized understanding.

## 2022-03-14 NOTE — Telephone Encounter (Signed)
Called and LVM for daughter, Amy. Relayed AL,NP message. Asked her to call back if she has any more questions.

## 2022-03-22 DIAGNOSIS — Z01419 Encounter for gynecological examination (general) (routine) without abnormal findings: Secondary | ICD-10-CM | POA: Diagnosis not present

## 2022-03-22 DIAGNOSIS — Z1231 Encounter for screening mammogram for malignant neoplasm of breast: Secondary | ICD-10-CM | POA: Diagnosis not present

## 2022-03-22 DIAGNOSIS — Z6825 Body mass index (BMI) 25.0-25.9, adult: Secondary | ICD-10-CM | POA: Diagnosis not present

## 2022-03-29 DIAGNOSIS — R946 Abnormal results of thyroid function studies: Secondary | ICD-10-CM | POA: Diagnosis not present

## 2022-05-05 DIAGNOSIS — H524 Presbyopia: Secondary | ICD-10-CM | POA: Diagnosis not present

## 2022-05-05 DIAGNOSIS — H52222 Regular astigmatism, left eye: Secondary | ICD-10-CM | POA: Diagnosis not present

## 2022-05-05 DIAGNOSIS — H2513 Age-related nuclear cataract, bilateral: Secondary | ICD-10-CM | POA: Diagnosis not present

## 2022-05-05 DIAGNOSIS — H5203 Hypermetropia, bilateral: Secondary | ICD-10-CM | POA: Diagnosis not present

## 2022-06-30 DIAGNOSIS — Z0000 Encounter for general adult medical examination without abnormal findings: Secondary | ICD-10-CM | POA: Diagnosis not present

## 2022-06-30 DIAGNOSIS — F321 Major depressive disorder, single episode, moderate: Secondary | ICD-10-CM | POA: Diagnosis not present

## 2022-06-30 DIAGNOSIS — E7800 Pure hypercholesterolemia, unspecified: Secondary | ICD-10-CM | POA: Diagnosis not present

## 2022-06-30 DIAGNOSIS — G3184 Mild cognitive impairment, so stated: Secondary | ICD-10-CM | POA: Diagnosis not present

## 2022-06-30 DIAGNOSIS — I700 Atherosclerosis of aorta: Secondary | ICD-10-CM | POA: Diagnosis not present

## 2022-06-30 DIAGNOSIS — E039 Hypothyroidism, unspecified: Secondary | ICD-10-CM | POA: Diagnosis not present

## 2023-02-14 DIAGNOSIS — E039 Hypothyroidism, unspecified: Secondary | ICD-10-CM | POA: Diagnosis not present

## 2023-02-22 ENCOUNTER — Telehealth: Payer: Self-pay | Admitting: Family Medicine

## 2023-02-22 NOTE — Telephone Encounter (Signed)
Pt's daughter called stating that she is needing for the provider to call her so that they can discuss privately some very noticeable decline the pt has had cognitively. She states it is hard to discuss these issues in front of the pt. Please advise.

## 2023-02-22 NOTE — Telephone Encounter (Signed)
LVM for daughter to call back to discuss pt decline (checked DPR)

## 2023-03-14 NOTE — Progress Notes (Unsigned)
PATIENT: Amanda Solis DOB: 1950/06/08  REASON FOR VISIT: follow up HISTORY FROM: patient  No chief complaint on file.   HISTORY OF PRESENT ILLNESS:  03/16/2023 ALL:  Amanda Solis returns for follow up for MCI. She was last seen 02/2022. MMSE was 25/30. We continues donepezil, memantine and escitalopram.   03/10/2022 ALL: Amanda Solis returns for follow up for MCI. She was last seen 08/2021. We continued donepezil and memantine and increased escitalopram to 10mg  daily. Since, she reports doing well. No significant changes. She has not been as active. She tells me that she needs to start walking again. She does go to the [facility] for aquatic therapy twice a week. Her daughter is with her and feels she is stable. She is driving without difficulty. Continues to volunteer.   08/26/2021 ALL: Amanda Solis returns for follow up for MCI. She was last seen 03/2021 and continued donepezil and escitalopram. We added memantine 5mg  BID and titrated to 10mg  BID.  She has tolerated medicaitons well. Memory seems fairly stable. She has had more trouble with inattention. She is more likely to leave cabinet doors opened or tasks unfinished. [name] feels she continues to withdraw in social situations. 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 She drives without difficulty. Able to manage medications and perform ADLs independently. She is sleeping well. She does wake with headaches sometimes. She snores occasionally but husband denies apneic events. She has seasonal allergies. BP is usually well managed.   04/06/2021 ALL: Amanda Solis returns for follow up for MCI. We continued donepezil 10mg  and escitalopram 5mg  QD and added memantine 5mg  BID at last visit 11/2020. Since, She is doing about the same. No specific changes. She did not start memantine. She is not sure why. She would like to start now. She continues to volunteer with [employer]. She goes to to the [facility] twice weekly. She is driving  without difficulty.   11/25/2020 ALL:  Amanda Solis returns for follow up for MCI. We increased donepezil to 10mg  daily and continued escitalopram 5mg  at last visit. She has tolerated medicaitons well. She feels she is doing ok. No significant changes. Her husband feels she continues to have difficulty with short term memory loss. She has a hard time remembering where she placed items around the house. No difficulty managing medicaitons or driving.   05/26/2020 ALL:  She returns today for follow up for MCI. She continues Aricept 5mg  daily. We added escitalopram 5mg  at last visit due to concerns of anxiety. She is uncertain if she has continued this medication. No obvious side effects that she can remember. She presents with her husband, today, who aids in history. They report that memory is fairly stable. Still having difficulty with short term memory. She continues to perform ADLs independently. Able to assist with managing home, finances and grocery shopping. Driving without difficulty. She walks with her daughter two days a week and goes to water aerobics regularly. She was working with PT for right knee pain but recently discharged for meeting goals.    10/01/2019 ALL:  Amanda Solis is a 71 y.o. female here today for follow up for memory loss. She continues Aricept. She is not sure it has helped much. She is tolerating well. Neurocognitive testing showed MCI. She does have a strong family history of dementia. She is able to perform ADL's independently. She drives without difficulty. She is able to manage finances. She does endorse anxiety. She has chronic back pain. She is considering  surgery but is worried about if it is the right thing for her. She is seeking a second opinion. She walks with her daughter, regularly. She volunteers at [employer] every Monday. She plays games on her phone often.   HISTORY: (copied from [name] note on 05/14/2019)  HPI:  Amanda Solis is a 72 y.o. female  here as requested by [name], PA for memory changes with a strong family history of Alzheimer's disease.  She has a past medical history of thyroid disease/hypothyroidism, hyperlipidemia, headache, chronic low back pain and spinal stenosis ([name]), anxiety, insomnia, arthritis, anxiety, insomnia, family history of Alzheimer's disease.  I reviewed [name], patient complaining of recent decline in memory, her thyroid had continued to remain low despite reductions in dosing of levothyroxine and she was referred to endocrinologist, due to reduced dose medication and her TSH remaining high and concerns with memory decline she was sent to her hormonal specialist in [place] who has been prescribing her multiple hormones and treatments over the last 6 months, also managing her thyroid levels,, unfortunately the patient has not seen any improvement in fatigue or memory decline.  Per notes CBC, CMP, TSH, vitamin D, vitamin B12 and lipid panel all were essentially normal (TSH 0.88) labs drawn March 05, 2019.  I did review these labs and agree that they were essentially normal except for vitamin B12 at 219 which can still signify B12 deficiency and we will have to retest that along with methylmalonic acid.  On review of multiple other notes, patient complaining of memory declined, often forgetting things, short-term and longer term, she misplaces items, worried about Alzheimer's given her family history, she also has a history of anxiety on Lexapro, hot flashes has resolved, insomnia, she also has chronic pain severe stenosis L4-L5 and receiving injections with [name] and declined surgery.  I also reviewed several years of epic notes for more history and memory loss which I did not find.  Also did not find anything in care everywhere.  No imaging of the brain as far as I can see.   Here with her husband who also provides information. Started a year ago slowly with forgetting things,  repeating things, they saw a metabolic specialist in [place] and they were referred here. She loses her phone quite often. No problems driving, she was lost once and is not driving as much, she does all the accounting of the bills and not missing bills, she checks everything thoroughly and keeps tracks of credit cards, keeps a spreadsheet very detailed, not missing things, she manages her own medication. She feels worse since Covid, more isolated, she is worried about Covid, she has chronic low back pain, she is worried about anesthesia and will not get surgery on her back, [name] is oldest sister close to 29 in [place] and within the last few years but unclear if diagnosed with dementia, her brother had dementia in a nursing home unclear, mother had dementia totally didn;t know people or talk but unknown type of dementia. SHe maybe feels her knee surgery 2 years ago may have started her symptoms, now she has chronic pain. Long-term is fine, short-term is more affected. Denies depression, she has some anxiety. She appears to have mood changes, personality changes, more emotional, no hallunications or delusions.    Reviewed notes, labs and imaging from outside physicians, which showed: see above   REVIEW OF SYSTEMS: Out of a complete 14 system review of symptoms,  the patient complains only of the following symptoms, memory loss, anxiety, chronic pain, and all other reviewed systems are negative.  ALLERGIES: No Known Allergies  HOME MEDICATIONS: Outpatient Medications Prior to Visit  Medication Sig Dispense Refill   Ascorbic Acid (VITAMIN C PO) Take 500 mg by mouth daily.     Cholecalciferol (VITAMIN D3 PO) Take 50 mcg by mouth daily.     Cyanocobalamin (VITAMIN B-12 PO) Take 2,500 mcg by mouth daily.     donepezil (ARICEPT) 10 MG tablet Take 1 tablet (10 mg total) by mouth at bedtime. 90 tablet 3   escitalopram (LEXAPRO) 10 MG tablet Take 1 tablet (10 mg total) by mouth daily. 90  tablet 3   levothyroxine (SYNTHROID) 75 MCG tablet Take 75 mcg by mouth daily.     memantine (NAMENDA) 10 MG tablet Take 1 tablet (10 mg total) by mouth 2 (two) times daily. 180 tablet 3   simvastatin (ZOCOR) 20 MG tablet TAKE 1 TABLET BY MOUTH DAILY AT 6 PM. PATIENT NEEDS OFFICE VISIT FOR FURTHER REFILLS 15 tablet 0   No facility-administered medications prior to visit.    PAST MEDICAL HISTORY: Past Medical History:  Diagnosis Date   Anxiety    Arthritis    Diverticulitis    in the past   Headache    Hyperlipidemia    Hypothyroidism    Thyroid disease     PAST SURGICAL HISTORY: Past Surgical History:  Procedure Laterality Date   APPENDECTOMY     CHOLECYSTECTOMY     TOTAL KNEE ARTHROPLASTY Right 09/15/2017   Procedure: RIGHT TOTAL KNEE ARTHROPLASTY;  Surgeon: Beverely Low, MD;  Location[facility];  Service: Orthopedics;  Laterality: Right;   TUBAL LIGATION     VARICOSE VEIN SURGERY      FAMILY HISTORY: Family History  Problem Relation Age of Onset   Diabetes Mother    Alzheimer's disease Mother    Prostate cancer Father    Alcohol abuse Father    Diabetes Father    Breast cancer Sister    Alzheimer's disease Sibling    Alzheimer's disease Sibling    Colon cancer Neg Hx     SOCIAL HISTORY: Social History   Socioeconomic History   Marital status: Married    Spouse name: Not on file   Number of children: 3   Years of education: 13   Highest education level: Not on file  Occupational History   Not on file  Tobacco Use   Smoking status: Former    Current packs/day: 0.00    Average packs/day: 0.3 packs/day for 2.0 years (0.5 ttl pk-yrs)    Types: Cigarettes    Start date: 44    Quit date: 85    Years since quitting: 46.9   Smokeless tobacco: Never  Vaping Use   Vaping status: Never Used  Substance and Sexual Activity   Alcohol use: Yes    Alcohol/week: 1.0 standard drink of alcohol    Types: 1 Glasses of wine per week   Drug use: Never   Sexual  activity: Yes    Birth control/protection: Surgical  Other Topics Concern   Not on file  Social History Narrative   Lives at home with spouse   Right handed   Caffeine: 2 cups coffee/day   Social Determinants of Health   Financial Resource Strain: Not on file  Food Insecurity: Not on file  Transportation Needs: Not on file  Physical Activity: Not on file  Stress: Not on file  Social Connections: Not on file  Intimate Partner Violence: Not on file      PHYSICAL EXAM  There were no vitals filed for this visit.      There is no height or weight on file to calculate BMI.  Generalized: Well developed, in no acute distress  Cardiology: normal rate and rhythm, no murmur noted Respiratory: clear to auscultation bilaterally  Neurological examination  Mentation: Alert oriented to time, place, history taking. Follows all commands speech and language fluent Cranial nerve II-XII: Pupils were equal round reactive to light. Extraocular movements were full, visual field were full on confrontational test. Facial sensation and strength were normal. Head turning and shoulder shrug  were normal and symmetric. Motor: The motor testing reveals 5 over 5 strength of all 4 extremities. Good symmetric motor tone is noted throughout.  Sensory: Sensory testing is intact to soft touch on all 4 extremities. No evidence of extinction is noted.  Coordination: Cerebellar testing reveals good finger-nose-finger and heel-to-shin bilaterally.  Gait and station: Gait is normal.   DIAGNOSTIC DATA (LABS, IMAGING, TESTING) - I reviewed patient records, labs, notes, testing and imaging myself where available.     03/10/2022   10:03 AM 08/26/2021   10:22 AM 11/25/2020   10:46 AM  MMSE - Mini Mental State Exam  Orientation to time 3 4 3   Orientation to Place 5 5 5   Registration 3 3 3   Attention/ Calculation 5 5 5   Recall 0 2 2  Language- name 2 objects 2 2 2   Language- repeat 1 1 1   Language- follow 3  step command 3 3 3   Language- read & follow direction 1 1 1   Write a sentence 1 1 1   Copy design 1 1 1   Total score 25 28 27      Lab Results  Component Value Date   WBC 8.4 09/18/2017   HGB 9.5 (L) 09/18/2017   HCT 29.3 (L) 09/18/2017   MCV 90.2 09/18/2017   PLT 195 09/18/2017      Component Value Date/Time   NA 142 05/14/2019 0953   K 4.4 05/14/2019 0953   CL 104 05/14/2019 0953   CL 103 09/16/2015 0000   CO2 24 05/14/2019 0953   GLUCOSE 94 05/14/2019 0953   GLUCOSE 114 (H) 09/16/2017 0410   BUN 18 05/14/2019 0953   CREATININE 0.79 05/14/2019 0953   CALCIUM 9.5 05/14/2019 0953   CALCIUM 9.6 09/16/2015 0000   PROT 7.7 10/20/2016 0923   ALBUMIN 5.0 (H) 10/20/2016 0923   AST 20 10/20/2016 0923   ALT 12 10/20/2016 0923   ALKPHOS 57 10/20/2016 0923   BILITOT 0.4 10/20/2016 0923   GFRNONAA 78 05/14/2019 0953   GFRNONAA 65 09/16/2015 0000   GFRAA 90 05/14/2019 0953   Lab Results  Component Value Date   CHOL 275 (H) 10/20/2016   HDL 111 10/20/2016   LDLCALC 147 (H) 10/20/2016   TRIG 85 10/20/2016   CHOLHDL 2.5 10/20/2016   Lab Results  Component Value Date   HGBA1C 5.6 10/20/2016   Lab Results  Component Value Date   VITAMINB12 1,366 (H) 05/14/2019   Lab Results  Component Value Date   TSH 0.621 01/24/2017     ASSESSMENT AND PLAN 72 y.o. year old female  has a past medical history of Anxiety, Arthritis, Diverticulitis, Headache, Hyperlipidemia, Hypothyroidism, and Thyroid disease. here with   No diagnosis found.   Lavola feels that memory is fairly stable today. MMSE 25/30. I will have her continue  donepezil 10mg  daily, memantine 10mg  BID and escitalopram 10mg  daily. She will monitor closely for any adverse effects. We have discussed options for continued testing for eval for Leqembi, neurocognitive testing, and or cognitive therapy. She is not interested at this time. She was encouraged to stay mentally and physically active. Healthy lifestyle habits  encouraged.  She will see me in 1 year, sooner if needed.  She verbalizes understanding and agreement with this plan.   No orders of the defined types were placed in this encounter.     No orders of the defined types were placed in this encounter.

## 2023-03-14 NOTE — Patient Instructions (Incomplete)
Below is our plan:  We will continue Aricept (donepezil) 5mg  daily for now. We can consider stopping this medication if you feel it is worsening tremor. We could consider changing/adding Namenda (memantine). I have included information in this packet.   Please make sure you are staying well hydrated. I recommend 50-60 ounces daily. Well balanced diet and regular exercise encouraged. Consistent sleep schedule with 6-8 hours recommended.   Please continue follow up with care team as directed.   Follow up with me in 6 months   You may receive a survey regarding today's visit. I encourage you to leave honest feed back as I do use this information to improve patient care. Thank you for seeing me today!   Management of Memory Problems   There are some general things you can do to help manage your memory problems.  Your memory may not in fact recover, but by using techniques and strategies you will be able to manage your memory difficulties better.   1)  Establish a routine. Try to establish and then stick to a regular routine.  By doing this, you will get used to what to expect and you will reduce the need to rely on your memory.  Also, try to do things at the same time of day, such as taking your medication or checking your calendar first thing in the morning. Think about think that you can do as a part of a regular routine and make a list.  Then enter them into a daily planner to remind you.  This will help you establish a routine.   2)  Organize your environment. Organize your environment so that it is uncluttered.  Decrease visual stimulation.  Place everyday items such as keys or cell phone in the same place every day (ie.  Basket next to front door) Use post it notes with a brief message to yourself (ie. Turn off light, lock the door) Use labels to indicate where things go (ie. Which cupboards are for food, dishes, etc.) Keep a notepad and pen by the telephone to take messages   3)  Memory  Aids A diary or journal/notebook/daily planner Making a list (shopping list, chore list, to do list that needs to be done) Using an alarm as a reminder (kitchen timer or cell phone alarm) Using cell phone to store information (Notes, Calendar, Reminders) Calendar/White board placed in a prominent position Post-it notes   In order for memory aids to be useful, you need to have good habits.  It's no good remembering to make a note in your journal if you don't remember to look in it.  Try setting aside a certain time of day to look in journal.   4)  Improving mood and managing fatigue. There may be other factors that contribute to memory difficulties.  Factors, such as anxiety, depression and tiredness can affect memory. Regular gentle exercise can help improve your mood and give you more energy. Exercise: there are short videos created by the [employer] on Health specially for older adults: https://bit.ly/2I30q97.  Mediterranean diet: which emphasizes fruits, vegetables, whole grains, legumes, fish, and other seafood; unsaturated fats such as olive oils; and low amounts of red meat, eggs, and sweets. A variation of this, called MIND (Mediterranean-DASH Intervention for Neurodegenerative Delay) incorporates the DASH (Dietary Approaches to Stop Hypertension) diet, which has been shown to lower high blood pressure, a risk factor for Alzheimer's disease. More information at: ExitMarketing.de.  Aerobic exercise that improve heart health is also  good for the mind.  [employer] on Aging have short videos for exercises that you can do at home: BlindWorkshop.com.pt Simple relaxation techniques may help relieve symptoms of anxiety Try to get back to completing activities or hobbies you enjoyed doing in the past. Learn to pace yourself through activities to decrease fatigue. Find out about some local support groups  where you can share experiences with others. Try and achieve 7-8 hours of sleep at night.   Resources for Family/Caregiver  Online caregiver support groups can be found at WesternTunes.it or call Alzheimer's Association's 24/7 hotline: (603)715-0386. [facility] Program offers in-person, virtual support groups and individual counseling for both care partners and persons with memory loss. Call for more information at ([phone])885-8490.   Advanced care plan: there are two types of Power of Attorney: healthcare and durable. Healthcare POA is a designated person to make healthcare decisions on your behalf if you were too sick to make them yourself. This person can be selected and documented by your physician. Durable POA has to be set up with a lawyer who takes charge of your finances and estate if you were too sick or cognitively impaired to manage your finances accurately. You can find a local Elder Therapist, art here: [place]Ranch.at.  Check out www.planyourlifespan.org, which will help you plan before a crisis and decide who will take care of life considerations in a circumstance where you may not be able to speak for yourself.   Helpful books (available on [employer] or your local bookstore):  By Dr. Carl Best: Keeping Love Alive as Memories Fade: The 5 Love Languages and the Alzheimer's Journey Jan 24, 2015 The Dementia Care Partner's Workbook: A Guide for Understanding, Education, and [employer] - September 23, 2017.  Both available for less than $15.   "Coping with behavior change in dementia: a family caregiver's guide" by Ricardo Jericho & Valora Piccolo "A Caregiver's Guide to Dementia: Using Activities and Other Strategies to Prevent, Reduce and Manage Behavioral Symptoms" by Mahala Menghini Gitlin and [employer].  Youth worker of Joy for the Person with Alzheimer's or Dementia" 4th edition by Tama High  Caregiver videos on common behaviors related to dementia:  PopulationGame.pl  [place] Caregiver Portal: free to sign up, links to local resources: https://[place]-caregivers.com/login    Tasks to improve attention/working memory 1. Good sleep hygiene (7-8 hrs of sleep) 2. Learning a new skill (Painting, Carpentry, Pottery, new language, Knitting). 3.Cognitive exercises (keep a daily journal, Puzzles) 4. Physical exercise and training  (30 min/day X 4 days week) 5. Being on Antidepressant if needed 6.Yoga, Meditation, Tai Chi 7. Decrease alcohol intake 8.Have a clear schedule and structure in daily routine   MIND Diet: The Mediterranean-DASH Diet Intervention for Neurodegenerative Delay, or MIND diet, targets the health of the aging brain. Research participants with the highest MIND diet scores had a significantly slower rate of cognitive decline compared with those with the lowest scores. The effects of the MIND diet on cognition showed greater effects than either the Mediterranean or the DASH diet alone.   The healthy items the MIND diet guidelines suggest include:   3+ servings a day of whole grains 1+ servings a day of vegetables (other than green leafy) 6+ servings a week of green leafy vegetables 5+ servings a week of nuts 4+ meals a week of beans 2+ servings a week of berries 2+ meals a week of poultry 1+ meals a week of fish Mainly olive oil if added fat is used  The unhealthy items, which are higher in saturated and trans fat, include: Less than 5 servings a week of pastries and sweets Less than 4 servings a week of red meat (including beef, pork, lamb, and products made from these meats) Less than one serving a week of cheese and fried foods Less than 1 tablespoon a day of butter/stick margarine

## 2023-03-15 NOTE — Telephone Encounter (Signed)
Called and spoke with daughter, Amy. She wanted to share info prior to appt. Feels mothers short term memory has continued to decline. Has no recollection of conversations 2-3 min later.   Incident occurred around end Oct 27/28 where she fell into shower door when home alone. Shower door shattered. She did not remember how this happened. Had large bruise across abdomen. She then brought up how she fell into shower after asking about bruise.  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 Otherwise, husband and daughter drives her. Needs more extensive cognitive testing daughter feels. Long term memory ok.   Daughter has also noticed decline in personal hygiene (not washing hands). Still does house cleaning but not to extent she used to. Does not cook anymore. Daughter cooks or they go out to eat. Has help with medication management. Has pill container. Husband helps her with this. Does not feel she could manage meds alone.   Aware I will send to Amy for review prior to appt tomorrow.

## 2023-03-16 ENCOUNTER — Encounter: Payer: Self-pay | Admitting: Family Medicine

## 2023-03-16 ENCOUNTER — Ambulatory Visit: Payer: PPO | Admitting: Family Medicine

## 2023-03-16 VITALS — BP 132/85 | HR 64 | Ht 61.0 in | Wt 132.5 lb

## 2023-03-16 DIAGNOSIS — F418 Other specified anxiety disorders: Secondary | ICD-10-CM | POA: Diagnosis not present

## 2023-03-16 DIAGNOSIS — G3184 Mild cognitive impairment, so stated: Secondary | ICD-10-CM

## 2023-03-16 DIAGNOSIS — R413 Other amnesia: Secondary | ICD-10-CM

## 2023-03-16 MED ORDER — MEMANTINE HCL 10 MG PO TABS: 10.0000 mg | ORAL_TABLET | Freq: Two times a day (BID) | ORAL | 3 refills | Status: DC

## 2023-03-16 MED ORDER — ESCITALOPRAM OXALATE 10 MG PO TABS: 10.0000 mg | ORAL_TABLET | Freq: Every day | ORAL | 3 refills | Status: DC

## 2023-03-16 MED ORDER — DONEPEZIL HCL 10 MG PO TABS: 10.0000 mg | ORAL_TABLET | Freq: Every day | ORAL | 3 refills | Status: DC

## 2023-03-21 DIAGNOSIS — E039 Hypothyroidism, unspecified: Secondary | ICD-10-CM | POA: Diagnosis not present

## 2023-03-30 DIAGNOSIS — N959 Unspecified menopausal and perimenopausal disorder: Secondary | ICD-10-CM | POA: Diagnosis not present

## 2023-03-30 DIAGNOSIS — N958 Other specified menopausal and perimenopausal disorders: Secondary | ICD-10-CM | POA: Diagnosis not present

## 2023-03-30 DIAGNOSIS — Z01419 Encounter for gynecological examination (general) (routine) without abnormal findings: Secondary | ICD-10-CM | POA: Diagnosis not present

## 2023-03-30 DIAGNOSIS — Z6825 Body mass index (BMI) 25.0-25.9, adult: Secondary | ICD-10-CM | POA: Diagnosis not present

## 2023-03-30 DIAGNOSIS — Z1231 Encounter for screening mammogram for malignant neoplasm of breast: Secondary | ICD-10-CM | POA: Diagnosis not present

## 2023-03-30 DIAGNOSIS — M816 Localized osteoporosis [Lequesne]: Secondary | ICD-10-CM | POA: Diagnosis not present

## 2023-03-31 LAB — APOE ALZHEIMER'S RISK

## 2023-03-31 LAB — ATN PROFILE
A -- Beta-amyloid 42/40 Ratio: 0.098 — ABNORMAL LOW (ref >0.102)
Beta-amyloid 40: 121.32 pg/mL
Beta-amyloid 42: 11.83 pg/mL
N -- NfL, Plasma: 5.96 pg/mL (ref 0.00–7.64)
T -- p-tau181: 1.64 pg/mL — ABNORMAL HIGH (ref 0.00–0.97)

## 2023-04-11 DIAGNOSIS — L83 Acanthosis nigricans: Secondary | ICD-10-CM | POA: Diagnosis not present

## 2023-04-11 DIAGNOSIS — N898 Other specified noninflammatory disorders of vagina: Secondary | ICD-10-CM | POA: Diagnosis not present

## 2023-07-07 DIAGNOSIS — E039 Hypothyroidism, unspecified: Secondary | ICD-10-CM | POA: Diagnosis not present

## 2023-07-07 DIAGNOSIS — E7800 Pure hypercholesterolemia, unspecified: Secondary | ICD-10-CM | POA: Diagnosis not present

## 2023-07-07 DIAGNOSIS — E559 Vitamin D deficiency, unspecified: Secondary | ICD-10-CM | POA: Diagnosis not present

## 2023-09-08 DIAGNOSIS — G3184 Mild cognitive impairment, so stated: Secondary | ICD-10-CM | POA: Diagnosis not present

## 2023-09-08 DIAGNOSIS — I700 Atherosclerosis of aorta: Secondary | ICD-10-CM | POA: Diagnosis not present

## 2023-09-08 DIAGNOSIS — F321 Major depressive disorder, single episode, moderate: Secondary | ICD-10-CM | POA: Diagnosis not present

## 2023-09-08 DIAGNOSIS — E039 Hypothyroidism, unspecified: Secondary | ICD-10-CM | POA: Diagnosis not present

## 2023-09-08 DIAGNOSIS — Z0000 Encounter for general adult medical examination without abnormal findings: Secondary | ICD-10-CM | POA: Diagnosis not present

## 2023-09-08 DIAGNOSIS — E7800 Pure hypercholesterolemia, unspecified: Secondary | ICD-10-CM | POA: Diagnosis not present

## 2023-10-11 NOTE — Patient Instructions (Signed)
 Below is our plan:  We will continue donepezil  10mg  daily, escitalopram  10mg  daily and memantine  10mg  twice daily.   Please make sure you are staying well hydrated. I recommend 50-60 ounces daily. Well balanced diet and regular exercise encouraged. Consistent sleep schedule with 6-8 hours recommended.   Please continue follow up with care team as directed.   Follow up with me in 6 months   You may receive a survey regarding today's visit. I encourage you to leave honest feed back as I do use this information to improve patient care. Thank you for seeing me today!   Management of Memory Problems   There are some general things you can do to help manage your memory problems.  Your memory may not in fact recover, but by using techniques and strategies you will be able to manage your memory difficulties better.   1)  Establish a routine. Try to establish and then stick to a regular routine.  By doing this, you will get used to what to expect and you will reduce the need to rely on your memory.  Also, try to do things at the same time of day, such as taking your medication or checking your calendar first thing in the morning. Think about think that you can do as a part of a regular routine and make a list.  Then enter them into a daily planner to remind you.  This will help you establish a routine.   2)  Organize your environment. Organize your environment so that it is uncluttered.  Decrease visual stimulation.  Place everyday items such as keys or cell phone in the same place every day (ie.  Basket next to front door) Use post it notes with a brief message to yourself (ie. Turn off light, lock the door) Use labels to indicate where things go (ie. Which cupboards are for food, dishes, etc.) Keep a notepad and pen by the telephone to take messages   3)  Memory Aids A diary or journal/notebook/daily planner Making a list (shopping list, chore list, to do list that needs to be done) Using an  alarm as a reminder (kitchen timer or cell phone alarm) Using cell phone to store information (Notes, Calendar, Reminders) Calendar/White board placed in a prominent position Post-it notes   In order for memory aids to be useful, you need to have good habits.  It's no good remembering to make a note in your journal if you don't remember to look in it.  Try setting aside a certain time of day to look in journal.   4)  Improving mood and managing fatigue. There may be other factors that contribute to memory difficulties.  Factors, such as anxiety, depression and tiredness can affect memory. Regular gentle exercise can help improve your mood and give you more energy. Exercise: there are short videos created by the [employer] on Health specially for older adults: https://bit.ly/2I30q97.  Mediterranean diet: which emphasizes fruits, vegetables, whole grains, legumes, fish, and other seafood; unsaturated fats such as olive oils; and low amounts of red meat, eggs, and sweets. A variation of this, called MIND (Mediterranean-DASH Intervention for Neurodegenerative Delay) incorporates the DASH (Dietary Approaches to Stop Hypertension) diet, which has been shown to lower high blood pressure, a risk factor for Alzheimer's disease. More information at: ExitMarketing.de.  Aerobic exercise that improve heart health is also good for the mind.  [employer] on Aging have short videos for exercises that you can do at home: BlindWorkshop.com.pt  Simple relaxation techniques may help relieve symptoms of anxiety Try to get back to completing activities or hobbies you enjoyed doing in the past. Learn to pace yourself through activities to decrease fatigue. Find out about some local support groups where you can share experiences with others. Try and achieve 7-8 hours of sleep at night.   Resources for Family/Caregiver  Online  caregiver support groups can be found at WesternTunes.it or call Alzheimer's Association's 24/7 hotline: [phone]. [facility] Counsel[facility] offers in-person, virtual support groups and individual counseling for both care partners and persons with memory loss. Call for more information at [phone].   Advanced care plan: there are two types of Power of Attorney: healthcare and durable. Healthcare POA is a designated person to make healthcare decisions on your behalf if you were too sick to make them yourself. This person can be selected and documented by your physician. Durable POA has to be set up with a lawyer who takes charge of your finances and estate if you were too sick or cognitively impaired to manage your finances accurately. You can find a local Elder Therapist, art here: [place].at.  Check out www.planyourlifespan.org, which will help you plan before a crisis and decide who will take care of life considerations in a circumstance where you may not be able to speak for yourself.   Helpful books (available on [employer] or your local bookstore):  By Dr. Katherene Gentry: Keeping Love Alive as Memories Fade: The 5 Love Languages and the Alzheimer's Journey Jan 24, 2015 The Dementia Care Partner's Workbook: A Guide for Understanding, Education, and [employer] - September 23, 2017.  Both available for less than $15.   Coping with behavior change in dementia: a family caregiver's guide by Landry Mirza & Mitzie Pizza A Caregiver's Guide to Dementia: Using Activities and Other Strategies to Prevent, Reduce and Manage Behavioral Symptoms by Leita SAILOR. [place] and Catherine Piersol.  Creating Moments of Joy for the Person with Alzheimer's or Dementia 4th edition by Melanie Mings  Caregiver videos on common behaviors related to dementia: PopulationGame.pl  [place] Caregiver Portal: free to sign up, links to local resources:  [email or web address]

## 2023-10-11 NOTE — Progress Notes (Unsigned)
 PATIENT: Amanda Solis DOB: 1950-12-28  REASON FOR VISIT: follow up HISTORY FROM: patient  No chief complaint on file.   HISTORY OF PRESENT ILLNESS:  10/17/2023 ALL:  Amanda Solis returns for follow up for MCI. She was last seen 02/2023 and noted worsening memory loss on donepezil , memantine  and escitalopram . MMSE was 27/30 but there were reports of difficulty remembering significant events such as falling in the shower and shattering the glass door. Previous neurocognitive testing concerning for amnesic MCI. ATN profile and APOE concerning for AD. Discussed with family but they declined further evaluation.   Since,   03/16/2023 ALL:  Amanda Solis returns for follow up for MCI. She was last seen 02/2022. MMSE was 25/30. We continued donepezil , memantine  and escitalopram . Since, memory loss seems to have worsened. She has more difficulty remembering conversations from a a few hours prior. She fell in the shower recently and the shower door shattered. When she was asked how the shower door shattered, she couldn't remember. Once asked about a bruise on her abdomen, she remembered that she had fallen in the shower. She drives once weekly to volunteer. Otherwise, husband and daughter drive. Husband helps administer meds. She no longer cooks. She continues to do house hold chores.   Neurocognitive testing with Dr Jackquline 08/2019 showed Amanda Solis's performance is viewed as a conservative estimate of actual abilities, as she was quite anxious and upset on the day of testing. Nevertheless, her performance validity measures were good. The profile shows a likely memory storage problem affecting acquisition and retention of visual and verbal information. Diagnosis was Amnestic MCI.   03/10/2022 ALL: Amanda Solis returns for follow up for MCI. She was last seen 08/2021. We continued donepezil  and memantine  and increased escitalopram  to 10mg  daily. Since, she reports doing well. No significant changes. She has  not been as active. She tells me that she needs to start walking again. She does go to the [facility] for aquatic therapy twice a week. Her daughter is with her and feels she is stable. She is driving without difficulty. Continues to volunteer.   08/26/2021 ALL: Amanda Solis returns for follow up for MCI. She was last seen 03/2021 and continued donepezil  and escitalopram . We added memantine  5mg  BID and titrated to 10mg  BID.  She has tolerated medicaitons well. Memory seems fairly stable. She has had more trouble with inattention. She is more likely to leave cabinet doors opened or tasks unfinished. Mr Inglis feels she continues to withdraw in social situations. 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 She drives without difficulty. Able to manage medications and perform ADLs independently. She is sleeping well. She does wake with headaches sometimes. She snores occasionally but husband denies apneic events. She has seasonal allergies. BP is usually well managed.   04/06/2021 ALL: Amanda Solis returns for follow up for MCI. We continued donepezil  10mg  and escitalopram  5mg  QD and added memantine  5mg  BID at last visit 11/2020. Since, She is doing about the same. No specific changes. She did not start memantine . She is not sure why. She would like to start now. She continues to volunteer with [employer]. She goes to to the [facility] twice weekly. She is driving without difficulty.   11/25/2020 ALL:  Amanda Solis returns for follow up for MCI. We increased donepezil  to 10mg  daily and continued escitalopram  5mg  at last visit. She has tolerated medicaitons well. She feels she is doing ok. No significant changes. Her husband feels she continues to have difficulty with short  term memory loss. She has a hard time remembering where she placed items around the house. No difficulty managing medicaitons or driving.   05/26/2020 ALL:  She returns today for follow up for MCI. She continues Aricept  5mg  daily. We added  escitalopram  5mg  at last visit due to concerns of anxiety. She is uncertain if she has continued this medication. No obvious side effects that she can remember. She presents with her husband, today, who aids in history. They report that memory is fairly stable. Still having difficulty with short term memory. She continues to perform ADLs independently. Able to assist with managing home, finances and grocery shopping. Driving without difficulty. She walks with her daughter two days a week and goes to water aerobics regularly. She was working with PT for right knee pain but recently discharged for meeting goals.    10/01/2019 ALL:  Amanda Solis is a 73 y.o. female here today for follow up for memory loss. She continues Aricept . She is not sure it has helped much. She is tolerating well. Neurocognitive testing showed MCI. She does have a strong family history of dementia. She is able to perform ADL's independently. She drives without difficulty. She is able to manage finances. She does endorse anxiety. She has chronic back pain. She is considering surgery but is worried about if it is the right thing for her. She is seeking a second opinion. She walks with her daughter, regularly. She volunteers at [employer] every Monday. She plays games on her phone often.   HISTORY: (copied from Dr Sharion note on 05/14/2019)  HPI:  Amanda Solis is a 73 y.o. female here as requested by Alben Therisa MATSU, PA for memory changes with a strong family history of Alzheimer's disease.  She has a past medical history of thyroid  disease/hypothyroidism, hyperlipidemia, headache, chronic low back pain and spinal stenosis (Dr. Colon), anxiety, insomnia, arthritis, anxiety, insomnia, family history of Alzheimer's disease.  I reviewed Therisa Campanile notes, patient complaining of recent decline in memory, her thyroid  had continued to remain low despite reductions in dosing of levothyroxine  and she was referred to  endocrinologist, due to reduced dose medication and her TSH remaining high and concerns with memory decline she was sent to her hormonal specialist in [place]  who has been prescribing her multiple hormones and treatments over the last 6 months, also managing her thyroid  levels,, unfortunately the patient has not seen any improvement in fatigue or memory decline.  Per notes CBC, CMP, TSH, vitamin D , vitamin B12 and lipid panel all were essentially normal (TSH 0.88) labs drawn March 05, 2019.  I did review these labs and agree that they were essentially normal except for vitamin B12 at 219 which can still signify B12 deficiency and we will have to retest that along with methylmalonic acid.  On review of multiple other notes, patient complaining of memory declined, often forgetting things, short-term and longer term, she misplaces items, worried about Alzheimer's given her family history, she also has a history of anxiety on Lexapro , hot flashes has resolved, insomnia, she also has chronic pain severe stenosis L4-L5 and receiving injections with Dr. Colon and declined surgery.  I also reviewed several years of epic notes for more history and memory loss which I did not find.  Also did not find anything in care everywhere.  No imaging of the brain as far as I can see.   Here with her husband who also provides information. Started a year ago slowly with forgetting  things, repeating things, they saw a metabolic specialist in [place] and they were referred here. She loses her phone quite often. No problems driving, she was lost once and is not driving as much, she does all the accounting of the bills and not missing bills, she checks everything thoroughly and keeps tracks of credit cards, keeps a spreadsheet very detailed, not missing things, she manages her own medication. She feels worse since Covid, more isolated, she is worried about Covid, she has chronic low back pain, she is worried about anesthesia  and will not get surgery on her back, her sister Elijah is oldest sister close to 81 in [place]  and within the last few years but unclear if diagnosed with dementia, her brother had dementia in a nursing home unclear, mother had dementia totally didn;t know people or talk but unknown type of dementia. She maybe feels her knee surgery 2 years ago may have started her symptoms, now she has chronic pain. Long-term is fine, short-term is more affected. Denies depression, she has some anxiety. She appears to have mood changes, personality changes, more emotional, no hallunications or delusions.    Reviewed notes, labs and imaging from outside physicians, which showed: see above   REVIEW OF SYSTEMS: Out of a complete 14 system review of symptoms, the patient complains only of the following symptoms, memory loss, anxiety, chronic pain, and all other reviewed systems are negative.  ALLERGIES: No Known Allergies  HOME MEDICATIONS: Outpatient Medications Prior to Visit  Medication Sig Dispense Refill   Ascorbic Acid (VITAMIN C PO) Take 500 mg by mouth daily.     Cholecalciferol  (VITAMIN D3 PO) Take 50 mcg by mouth daily.     Cyanocobalamin (VITAMIN B-12 PO) Take 2,500 mcg by mouth daily.     donepezil  (ARICEPT ) 10 MG tablet Take 1 tablet (10 mg total) by mouth at bedtime. 90 tablet 3   escitalopram  (LEXAPRO ) 10 MG tablet Take 1 tablet (10 mg total) by mouth daily. 90 tablet 3   levothyroxine  (SYNTHROID ) 75 MCG tablet Take 75 mcg by mouth daily.     memantine  (NAMENDA ) 10 MG tablet Take 1 tablet (10 mg total) by mouth 2 (two) times daily. 180 tablet 3   simvastatin  (ZOCOR ) 20 MG tablet TAKE 1 TABLET BY MOUTH DAILY AT 6 PM. PATIENT NEEDS OFFICE VISIT FOR FURTHER REFILLS 15 tablet 0   No facility-administered medications prior to visit.    PAST MEDICAL HISTORY: Past Medical History:  Diagnosis Date   Anxiety    Arthritis    Diverticulitis    in the past   Headache    Hyperlipidemia     Hypothyroidism    Thyroid  disease     PAST SURGICAL HISTORY: Past Surgical History:  Procedure Laterality Date   APPENDECTOMY     CHOLECYSTECTOMY     TOTAL KNEE ARTHROPLASTY Right 09/15/2017   Procedure: RIGHT TOTAL KNEE ARTHROPLASTY;  Surgeon: Kay Kemps, MD;  Location: [facility] OR;  Service: Orthopedics;  Laterality: Right;   TUBAL LIGATION     VARICOSE VEIN SURGERY      FAMILY HISTORY: Family History  Problem Relation Age of Onset   Diabetes Mother    Alzheimer's disease Mother    Prostate cancer Father    Alcohol abuse Father    Diabetes Father    Breast cancer Sister    Alzheimer's disease Sibling    Alzheimer's disease Sibling    Colon cancer Neg Hx     SOCIAL HISTORY: Social History  Socioeconomic History   Marital status: Married    Spouse name: Not on file   Number of children: 3   Years of education: 97   Highest education level: Not on file  Occupational History   Not on file  Tobacco Use   Smoking status: Former    Current packs/day: 0.00    Average packs/day: 0.3 packs/day for 2.0 years (0.5 ttl pk-yrs)    Types: Cigarettes    Start date: 37    Quit date: 24    Years since quitting: 47.4   Smokeless tobacco: Never  Vaping Use   Vaping status: Never Used  Substance and Sexual Activity   Alcohol use: Yes    Alcohol/week: 1.0 standard drink of alcohol    Types: 1 Glasses of wine per week   Drug use: Never   Sexual activity: Yes    Birth control/protection: Surgical  Other Topics Concern   Not on file  Social History Narrative   Lives at home with spouse   Right handed   Caffeine: 2 cups coffee/day   Social Drivers of Corporate investment banker Strain: Not on file  Food Insecurity: Not on file  Transportation Needs: Not on file  Physical Activity: Not on file  Stress: Not on file  Social Connections: Not on file  Intimate Partner Violence: Not on file      PHYSICAL EXAM  There were no vitals filed for this visit.   There is  no height or weight on file to calculate BMI.  Generalized: Well developed, in no acute distress  Cardiology: normal rate and rhythm, no murmur noted Respiratory: clear to auscultation bilaterally  Neurological examination  Mentation: Alert oriented to time, place, history taking. Follows all commands speech and language fluent Cranial nerve II-XII: Pupils were equal round reactive to light. Extraocular movements were full, visual field were full on confrontational test. Facial sensation and strength were normal. Head turning and shoulder shrug  were normal and symmetric. Motor: The motor testing reveals 5 over 5 strength of all 4 extremities. Good symmetric motor tone is noted throughout.  Sensory: Sensory testing is intact to soft touch on all 4 extremities. No evidence of extinction is noted.  Coordination: Cerebellar testing reveals good finger-nose-finger and heel-to-shin bilaterally.  Gait and station: Gait is normal.    DIAGNOSTIC DATA (LABS, IMAGING, TESTING) - I reviewed patient records, labs, notes, testing and imaging myself where available.     03/16/2023    9:51 AM 03/10/2022   10:03 AM 08/26/2021   10:22 AM  MMSE - Mini Mental State Exam  Orientation to time 4 3 4   Orientation to Place 5 5 5   Registration 3 3 3   Attention/ Calculation 5 5 5   Recall 2 0 2  Language- name 2 objects 2 2 2   Language- repeat 1 1 1   Language- follow 3 step command 3 3 3   Language- read & follow direction 1 1 1   Write a sentence 1 1 1   Copy design 0 1 1  Total score 27 25 28      Lab Results  Component Value Date   WBC 8.4 09/18/2017   HGB 9.5 (L) 09/18/2017   HCT 29.3 (L) 09/18/2017   MCV 90.2 09/18/2017   PLT 195 09/18/2017      Component Value Date/Time   NA 142 05/14/2019 0953   K 4.4 05/14/2019 0953   CL 104 05/14/2019 0953   CL 103 09/16/2015 0000   CO2 24 05/14/2019  9046   GLUCOSE 94 05/14/2019 0953   GLUCOSE 114 (H) 09/16/2017 0410   BUN 18 05/14/2019 0953    CREATININE 0.79 05/14/2019 0953   CALCIUM 9.5 05/14/2019 0953   CALCIUM 9.6 09/16/2015 0000   PROT 7.7 10/20/2016 0923   ALBUMIN 5.0 (H) 10/20/2016 0923   AST 20 10/20/2016 0923   ALT 12 10/20/2016 0923   ALKPHOS 57 10/20/2016 0923   BILITOT 0.4 10/20/2016 0923   GFRNONAA 78 05/14/2019 0953   GFRNONAA 65 09/16/2015 0000   GFRAA 90 05/14/2019 0953   Lab Results  Component Value Date   CHOL 275 (H) 10/20/2016   HDL 111 10/20/2016   LDLCALC 147 (H) 10/20/2016   TRIG 85 10/20/2016   CHOLHDL 2.5 10/20/2016   Lab Results  Component Value Date   HGBA1C 5.6 10/20/2016   Lab Results  Component Value Date   VITAMINB12 1,366 (H) 05/14/2019   Lab Results  Component Value Date   TSH 0.621 01/24/2017     ASSESSMENT AND PLAN 73 y.o. year old female  has a past medical history of Anxiety, Arthritis, Diverticulitis, Headache, Hyperlipidemia, Hypothyroidism, and Thyroid  disease. here with   No diagnosis found.   Tiamarie feels that memory is fairly stable, however, her daughters are concerned about recent events where she has had significant difficulty remembering major events. Mood seems stable. MMSE 27/30, previously 25/30. I will have her continue donepezil  10mg  daily, memantine  10mg  BID and escitalopram  10mg  daily. We have discussed options for continued testing for eval for AD infusion medications, neurocognitive testing, and or cognitive therapy. We will check blood work, today. May consider repeat neurocog testing versus imaging pending results. Neurocog testing will not change course of treatment but may offer family clarity in deficits. She was encouraged to stay mentally and physically active. Healthy lifestyle habits encouraged.  She will see me in 6 months, sooner if needed.  She verbalizes understanding and agreement with this plan.   No orders of the defined types were placed in this encounter.     No orders of the defined types were placed in this encounter.

## 2023-10-17 ENCOUNTER — Encounter: Payer: Self-pay | Admitting: Family Medicine

## 2023-10-17 ENCOUNTER — Ambulatory Visit: Payer: PPO | Admitting: Family Medicine

## 2023-10-17 VITALS — BP 148/79 | HR 78 | Ht 61.0 in | Wt 136.5 lb

## 2023-10-17 DIAGNOSIS — G3184 Mild cognitive impairment, so stated: Secondary | ICD-10-CM

## 2023-10-17 DIAGNOSIS — F418 Other specified anxiety disorders: Secondary | ICD-10-CM

## 2023-10-17 DIAGNOSIS — R413 Other amnesia: Secondary | ICD-10-CM

## 2023-10-17 MED ORDER — MEMANTINE HCL 10 MG PO TABS: 10.0000 mg | ORAL_TABLET | Freq: Two times a day (BID) | ORAL | 3 refills | Status: AC

## 2023-10-17 MED ORDER — DONEPEZIL HCL 10 MG PO TABS: 10.0000 mg | ORAL_TABLET | Freq: Every day | ORAL | 3 refills | Status: AC

## 2023-10-17 MED ORDER — ESCITALOPRAM OXALATE 10 MG PO TABS: 10.0000 mg | ORAL_TABLET | Freq: Every day | ORAL | 3 refills | Status: AC

## 2023-10-23 DIAGNOSIS — E7800 Pure hypercholesterolemia, unspecified: Secondary | ICD-10-CM | POA: Diagnosis not present

## 2023-10-23 DIAGNOSIS — E039 Hypothyroidism, unspecified: Secondary | ICD-10-CM | POA: Diagnosis not present

## 2023-10-23 DIAGNOSIS — F321 Major depressive disorder, single episode, moderate: Secondary | ICD-10-CM | POA: Diagnosis not present

## 2023-10-25 DIAGNOSIS — E039 Hypothyroidism, unspecified: Secondary | ICD-10-CM | POA: Diagnosis not present

## 2023-10-25 DIAGNOSIS — G3184 Mild cognitive impairment, so stated: Secondary | ICD-10-CM | POA: Diagnosis not present

## 2023-10-25 DIAGNOSIS — R55 Syncope and collapse: Secondary | ICD-10-CM | POA: Diagnosis not present

## 2023-10-28 ENCOUNTER — Other Ambulatory Visit: Payer: Self-pay

## 2023-10-28 ENCOUNTER — Encounter (HOSPITAL_BASED_OUTPATIENT_CLINIC_OR_DEPARTMENT_OTHER): Payer: Self-pay | Admitting: Urology

## 2023-10-28 ENCOUNTER — Emergency Department (HOSPITAL_BASED_OUTPATIENT_CLINIC_OR_DEPARTMENT_OTHER)

## 2023-10-28 ENCOUNTER — Emergency Department (HOSPITAL_BASED_OUTPATIENT_CLINIC_OR_DEPARTMENT_OTHER)
Admission: EM | Admit: 2023-10-28 | Discharge: 2023-10-28 | Disposition: A | Discharge status: Home / Self Care | Attending: Emergency Medicine | Admitting: Emergency Medicine

## 2023-10-28 DIAGNOSIS — R42 Dizziness and giddiness: Secondary | ICD-10-CM | POA: Diagnosis not present

## 2023-10-28 DIAGNOSIS — S060X0A Concussion without loss of consciousness, initial encounter: Secondary | ICD-10-CM | POA: Insufficient documentation

## 2023-10-28 DIAGNOSIS — Z96651 Presence of right artificial knee joint: Secondary | ICD-10-CM | POA: Insufficient documentation

## 2023-10-28 DIAGNOSIS — S060X1A Concussion with loss of consciousness of 30 minutes or less, initial encounter: Secondary | ICD-10-CM | POA: Diagnosis not present

## 2023-10-28 DIAGNOSIS — Z79899 Other long term (current) drug therapy: Secondary | ICD-10-CM | POA: Diagnosis not present

## 2023-10-28 DIAGNOSIS — W19XXXA Unspecified fall, initial encounter: Secondary | ICD-10-CM | POA: Diagnosis not present

## 2023-10-28 DIAGNOSIS — R55 Syncope and collapse: Secondary | ICD-10-CM | POA: Diagnosis not present

## 2023-10-28 DIAGNOSIS — Z87891 Personal history of nicotine dependence: Secondary | ICD-10-CM | POA: Insufficient documentation

## 2023-10-28 DIAGNOSIS — G319 Degenerative disease of nervous system, unspecified: Secondary | ICD-10-CM | POA: Diagnosis not present

## 2023-10-28 DIAGNOSIS — E039 Hypothyroidism, unspecified: Secondary | ICD-10-CM | POA: Diagnosis not present

## 2023-10-28 DIAGNOSIS — S199XXA Unspecified injury of neck, initial encounter: Secondary | ICD-10-CM | POA: Diagnosis not present

## 2023-10-28 DIAGNOSIS — R519 Headache, unspecified: Secondary | ICD-10-CM | POA: Diagnosis not present

## 2023-10-28 DIAGNOSIS — R9082 White matter disease, unspecified: Secondary | ICD-10-CM | POA: Diagnosis not present

## 2023-10-28 DIAGNOSIS — Y92002 Bathroom of unspecified non-institutional (private) residence single-family (private) house as the place of occurrence of the external cause: Secondary | ICD-10-CM | POA: Insufficient documentation

## 2023-10-28 DIAGNOSIS — S060X9A Concussion with loss of consciousness of unspecified duration, initial encounter: Secondary | ICD-10-CM

## 2023-10-28 DIAGNOSIS — M6281 Muscle weakness (generalized): Secondary | ICD-10-CM | POA: Diagnosis not present

## 2023-10-28 DIAGNOSIS — S0990XA Unspecified injury of head, initial encounter: Secondary | ICD-10-CM | POA: Diagnosis not present

## 2023-10-28 DIAGNOSIS — R5381 Other malaise: Secondary | ICD-10-CM | POA: Diagnosis not present

## 2023-10-28 NOTE — Discharge Instructions (Addendum)
 We evaluated you for your headaches after head injury.  Your CT scans did not show any dangerous injury to your head or neck.  Your symptoms are most likely due to a concussion.  Please be sure to get lots of rest and take with all's milligrams of Tylenol  every 6 hours.  Please try to avoid anything that makes your headache worse.  Please follow-up with your neurologist.  Please return if you have any new or worsening symptoms such as fevers or chills, cough, painful urination, abdominal pain, chest pain, difficulty breathing, further falls, fainting, or any other concerning symptoms.

## 2023-10-28 NOTE — ED Provider Notes (Signed)
 [place] EMERGENCY DEPARTMENT AT [facility] [place] Provider Note  CSN: [id number] Arrival date & time: 10/28/23 1453  Chief Complaint(s) No chief complaint on file.  HPI Amanda Solis is a 73 y.o. female history of cognitive impairment, hypertension, lipidemia presenting with headache.  Patient had an unwitnessed fall in bathroom, struck head on bathroom scale.  Found by husband.  Subsequently has been having headaches, feeling tired, not eating as much.  Saw primary doctor Thursday and had testing which was reassuring although records not available.  No fevers, chills, chest pain, shortness of breath, abdominal pain, nausea or vomiting, phonophobia, photophobia, numbness, tingling, pain in the arms or legs, back pain.  No other falls.   Past Medical History Past Medical History:  Diagnosis Date   Anxiety    Arthritis    Diverticulitis    in the past   Headache    Hyperlipidemia    Hypothyroidism    Thyroid  disease    Patient Active Problem List   Diagnosis Date Noted   Amnestic MCI (mild cognitive impairment with memory loss) 10/01/2019   Anxiety 10/01/2019   Status post total knee replacement, right 09/15/2017   Viral URI with cough 04/19/2017   Family history of Alzheimer's disease 09/07/2016   Family history of breast cancer in sister 03/05/2016   Family history of diabetes mellitus in father 03/05/2016   S/P cholecystectomy in 1978 03/02/2016   HLD (hyperlipidemia) 03/02/2016   Hypothyroidism 03/02/2016   Vitamin D  deficiency 03/02/2016   Osteopenia 03/02/2016   Insomnia 03/02/2016   h/o Diverticulitis of colon without hemorrhage 2016 03/06/2015   Home Medication(s) Prior to Admission medications   Medication Sig Start Date End Date Taking? Authorizing Provider  Ascorbic Acid (VITAMIN C PO) Take 500 mg by mouth daily.    [provider]  Cholecalciferol  (VITAMIN D3 PO) Take 50 mcg by mouth daily.    [provider]  Cyanocobalamin  (VITAMIN B-12 PO) Take 2,500 mcg by mouth daily.    [provider]  donepezil  (ARICEPT ) 10 MG tablet Take 1 tablet (10 mg total) by mouth at bedtime. 10/17/23   Lomax, Amy, NP  escitalopram  (LEXAPRO ) 10 MG tablet Take 1 tablet (10 mg total) by mouth daily. 10/17/23   Lomax, Amy, NP  levothyroxine  (SYNTHROID ) 75 MCG tablet Take 75 mcg by mouth daily. 05/02/19   [provider]  memantine  (NAMENDA ) 10 MG tablet Take 1 tablet (10 mg total) by mouth 2 (two) times daily. 10/17/23   Lomax, Amy, NP  simvastatin  (ZOCOR ) 20 MG tablet TAKE 1 TABLET BY MOUTH DAILY AT 6 PM. PATIENT NEEDS OFFICE VISIT FOR FURTHER REFILLS 10/23/17   Midge Sober, DO                                                                                                                                    Past Surgical History Past Surgical History:  Procedure Laterality  Date   APPENDECTOMY     CHOLECYSTECTOMY     TOTAL KNEE ARTHROPLASTY Right 09/15/2017   Procedure: RIGHT TOTAL KNEE ARTHROPLASTY;  Surgeon: Kay Kemps, MD;  Location: [facility] OR;  Service: Orthopedics;  Laterality: Right;   TUBAL LIGATION     VARICOSE VEIN SURGERY     Family History Family History  Problem Relation Age of Onset   Diabetes Mother    Alzheimer's disease Mother    Prostate cancer Father    Alcohol abuse Father    Diabetes Father    Breast cancer Sister    Alzheimer's disease Sibling    Alzheimer's disease Sibling    Colon cancer Neg Hx     Social History Social History   Tobacco Use   Smoking status: Former    Current packs/day: 0.00    Average packs/day: 0.3 packs/day for 2.0 years (0.5 ttl pk-yrs)    Types: Cigarettes    Start date: 22    Quit date: 1978    Years since quitting: 47.5   Smokeless tobacco: Never  Vaping Use   Vaping status: Never Used  Substance Use Topics   Alcohol use: Yes    Alcohol/week: 1.0 standard drink of alcohol    Types: 1 Glasses of wine per week   Drug use: Never   Allergies Patient  has no known allergies.  Review of Systems Review of Systems  All other systems reviewed and are negative.   Physical Exam Vital Signs  I have reviewed the triage vital signs BP (!) 145/79 (BP Location: Left Arm)   Pulse 65   Temp 97.8 F (36.6 C) (Oral)   Resp 16   Ht 5' 1 (1.549 m)   Wt 61.9 kg   SpO2 100%   BMI 25.78 kg/m  Physical Exam Vitals and nursing note reviewed.  Constitutional:      General: She is not in acute distress.    Appearance: She is well-developed.  HENT:     Head: Normocephalic and atraumatic.     Mouth/Throat:     Mouth: Mucous membranes are moist.  Eyes:     Pupils: Pupils are equal, round, and reactive to light.  Cardiovascular:     Rate and Rhythm: Normal rate and regular rhythm.     Heart sounds: No murmur heard. Pulmonary:     Effort: Pulmonary effort is normal. No respiratory distress.     Breath sounds: Normal breath sounds.  Abdominal:     General: Abdomen is flat.     Palpations: Abdomen is soft.     Tenderness: There is no abdominal tenderness.  Musculoskeletal:        General: No tenderness.     Right lower leg: No edema.     Left lower leg: No edema.     Comments: No midline C, T, L-spine tenderness.  No chest wall tenderness or crepitus.  Full painless range of motion at the bilateral upper extremities including the shoulders, elbows, wrists, hand and fingers, and in the bilateral lower extremities including the hips, knees, ankle, toes.  No focal bony tenderness, injury or deformity.  Skin:    General: Skin is warm and dry.  Neurological:     General: No focal deficit present.     Mental Status: She is alert. Mental status is at baseline.     Comments: Cranial nerves II through XII intact, strength 5 out of 5 in the bilateral upper and lower extremities, no sensory deficit to light touch, no  dysmetria on finger-nose-finger testing, ambulatory with steady gait.  Psychiatric:        Mood and Affect: Mood normal.         Behavior: Behavior normal.     ED Results and Treatments Labs (all labs ordered are listed, but only abnormal results are displayed) Labs Reviewed - No data to display                                                                                                                        Radiology CT Head Wo Contrast Result Date: 10/28/2023 CLINICAL DATA:  Trauma.  Head injury. EXAM: CT HEAD WITHOUT CONTRAST CT CERVICAL SPINE WITHOUT CONTRAST TECHNIQUE: Multidetector CT imaging of the head and cervical spine was performed following the standard protocol without intravenous contrast. Multiplanar CT image reconstructions of the cervical spine were also generated. RADIATION DOSE REDUCTION: This exam was performed according to the departmental dose-optimization program which includes automated exposure control, adjustment of the mA and/or kV according to patient size and/or use of iterative reconstruction technique. COMPARISON:  None Available. FINDINGS: CT HEAD FINDINGS Brain: No acute intracranial hemorrhage. No focal mass lesion. No CT evidence of acute infarction. No midline shift or mass effect. No hydrocephalus. Basilar cisterns are patent. There are periventricular and subcortical white matter hypodensities. Generalized cortical atrophy. Vascular: No hyperdense vessel or unexpected calcification. Skull: Normal. Negative for fracture or focal lesion. Sinuses/Orbits: Paranasal sinuses and mastoid air cells are clear. Orbits are clear. Other: None. CT CERVICAL SPINE FINDINGS Alignment: Normal alignment of the cervical vertebral bodies. Skull base and vertebrae: Normal craniocervical junction. No loss of vertebral body height or disc height. Normal facet articulation. No evidence of fracture. Soft tissues and spinal canal: No prevertebral soft tissue swelling. No perispinal or epidural hematoma. Disc levels: There is endplate sclerosis and joint space narrowing as well as anterior osteophytosis from C3-C7. No  acute findings no subluxation Upper chest: Clear Other: None IMPRESSION: HEAD CT: 1. No acute intracranial findings. 2. Atrophy and white matter microvascular disease. CERVICAL SPINE CT: 1. No cervical spine fracture. 2. Cervical degenerative disc disease. Electronically Signed   By: Jackquline Boxer M.D.   On: 10/28/2023 16:56   CT Cervical Spine Wo Contrast Result Date: 10/28/2023 CLINICAL DATA:  Trauma.  Head injury. EXAM: CT HEAD WITHOUT CONTRAST CT CERVICAL SPINE WITHOUT CONTRAST TECHNIQUE: Multidetector CT imaging of the head and cervical spine was performed following the standard protocol without intravenous contrast. Multiplanar CT image reconstructions of the cervical spine were also generated. RADIATION DOSE REDUCTION: This exam was performed according to the departmental dose-optimization program which includes automated exposure control, adjustment of the mA and/or kV according to patient size and/or use of iterative reconstruction technique. COMPARISON:  None Available. FINDINGS: CT HEAD FINDINGS Brain: No acute intracranial hemorrhage. No focal mass lesion. No CT evidence of acute infarction. No midline shift or mass effect. No hydrocephalus. Basilar cisterns are patent. There are periventricular and subcortical white matter hypodensities. Generalized cortical atrophy.  Vascular: No hyperdense vessel or unexpected calcification. Skull: Normal. Negative for fracture or focal lesion. Sinuses/Orbits: Paranasal sinuses and mastoid air cells are clear. Orbits are clear. Other: None. CT CERVICAL SPINE FINDINGS Alignment: Normal alignment of the cervical vertebral bodies. Skull base and vertebrae: Normal craniocervical junction. No loss of vertebral body height or disc height. Normal facet articulation. No evidence of fracture. Soft tissues and spinal canal: No prevertebral soft tissue swelling. No perispinal or epidural hematoma. Disc levels: There is endplate sclerosis and joint space narrowing as well as  anterior osteophytosis from C3-C7. No acute findings no subluxation Upper chest: Clear Other: None IMPRESSION: HEAD CT: 1. No acute intracranial findings. 2. Atrophy and white matter microvascular disease. CERVICAL SPINE CT: 1. No cervical spine fracture. 2. Cervical degenerative disc disease. Electronically Signed   By: Jackquline Boxer M.D.   On: 10/28/2023 16:56    Pertinent labs & imaging results that were available during my care of the patient were reviewed by me and considered in my medical decision making (see MDM for details).  Medications Ordered in ED Medications - No data to display                                                                                                                                   Procedures Procedures  (including critical care time)  Medical Decision Making / ED Course   MDM:  73 year old presenting to the emergency department with headache.  Patient with unwitnessed fall at home.  History of cognitive impairment.  Here denying any symptoms other than headache and generalized weakness.  Seems most consistent with possible concussion.  Given age will obtain CT head to evaluate for other intracranial process such as subdural, intracranial process, skull fracture although external head examination is reassuring.  Neurological exam is normal.  No signs of other injuries.  Patient denies any other focal symptoms or exam findings to suggest other possible underlying cause like occult infectious process, dehydration, electrolyte derangement.  She also reportedly had labs test by primary doctor on Thursday which were reassuring.  Will reassess.  Clinical Course as of 10/28/23 1716  Sat Oct 28, 2023  1715 CT head, CT cervical spine negative for acute process.  Given history feel patient is stable for discharge to home.  Recommended patient can follow-up with her existing outpatient neurologist.  Recommend return precautions for any new or worsening symptoms  that would suggest any other cause of her fatigue.  Since she had reassuring testing per husband at primary doctor few days ago do not think we need to repeat this today. Will discharge patient to home. All questions answered. Patient comfortable with plan of discharge. Return precautions discussed with patient and specified on the after visit summary.  [WS]    Clinical Course User Index [WS] Francesca Elsie CROME, MD     Additional history obtained: -Additional history obtained from spouse -External records from outside source  obtained and reviewed including: Chart review including previous notes, labs, imaging, consultation notes including prior neurology notes     Imaging Studies ordered: I ordered imaging studies including CT head and cervical spine  On my interpretation imaging demonstrates no acute process I independently visualized and interpreted imaging. I agree with the radiologist interpretation   Medicines ordered and prescription drug management: No orders of the defined types were placed in this encounter.   -I have reviewed the patients home medicines and have made adjustments as needed    Reevaluation: After the interventions noted above, I reevaluated the patient and found that their symptoms have improved  Co morbidities that complicate the patient evaluation  Past Medical History:  Diagnosis Date   Anxiety    Arthritis    Diverticulitis    in the past   Headache    Hyperlipidemia    Hypothyroidism    Thyroid  disease       Dispostion: Disposition decision including need for hospitalization was considered, and patient discharged from emergency department.    Final Clinical Impression(s) / ED Diagnoses Final diagnoses:  Concussion with loss of consciousness, initial encounter     This chart was dictated using voice recognition software.  Despite best efforts to proofread,  errors can occur which can change the documentation meaning.     Francesca Elsie CROME, MD 10/28/23 [phone]

## 2023-10-28 NOTE — ED Triage Notes (Signed)
 Pt family states pt had fall in bathroom on Wednesday night, unknown head injury, Saw pcp on Thursday and did blood and EKG, all normal  States continued headache since fall, not eating well and has been more lethargic as well

## 2023-10-31 ENCOUNTER — Encounter: Payer: Self-pay | Admitting: Family Medicine

## 2023-10-31 ENCOUNTER — Ambulatory Visit: Payer: Self-pay | Admitting: Family Medicine

## 2023-11-02 ENCOUNTER — Inpatient Hospital Stay: Admitting: Neurology

## 2023-11-23 DIAGNOSIS — E039 Hypothyroidism, unspecified: Secondary | ICD-10-CM | POA: Diagnosis not present

## 2023-11-23 DIAGNOSIS — F321 Major depressive disorder, single episode, moderate: Secondary | ICD-10-CM | POA: Diagnosis not present

## 2023-11-23 DIAGNOSIS — E7800 Pure hypercholesterolemia, unspecified: Secondary | ICD-10-CM | POA: Diagnosis not present

## 2023-12-24 DIAGNOSIS — E7800 Pure hypercholesterolemia, unspecified: Secondary | ICD-10-CM | POA: Diagnosis not present

## 2023-12-24 DIAGNOSIS — E039 Hypothyroidism, unspecified: Secondary | ICD-10-CM | POA: Diagnosis not present

## 2023-12-24 DIAGNOSIS — F321 Major depressive disorder, single episode, moderate: Secondary | ICD-10-CM | POA: Diagnosis not present

## 2024-01-23 DIAGNOSIS — E039 Hypothyroidism, unspecified: Secondary | ICD-10-CM | POA: Diagnosis not present

## 2024-01-23 DIAGNOSIS — E7800 Pure hypercholesterolemia, unspecified: Secondary | ICD-10-CM | POA: Diagnosis not present

## 2024-01-23 DIAGNOSIS — F321 Major depressive disorder, single episode, moderate: Secondary | ICD-10-CM | POA: Diagnosis not present

## 2024-02-23 DIAGNOSIS — E7800 Pure hypercholesterolemia, unspecified: Secondary | ICD-10-CM | POA: Diagnosis not present

## 2024-02-23 DIAGNOSIS — F321 Major depressive disorder, single episode, moderate: Secondary | ICD-10-CM | POA: Diagnosis not present

## 2024-02-23 DIAGNOSIS — E039 Hypothyroidism, unspecified: Secondary | ICD-10-CM | POA: Diagnosis not present

## 2024-03-24 DIAGNOSIS — E039 Hypothyroidism, unspecified: Secondary | ICD-10-CM | POA: Diagnosis not present

## 2024-03-24 DIAGNOSIS — F321 Major depressive disorder, single episode, moderate: Secondary | ICD-10-CM | POA: Diagnosis not present

## 2024-03-24 DIAGNOSIS — E7800 Pure hypercholesterolemia, unspecified: Secondary | ICD-10-CM | POA: Diagnosis not present

## 2024-04-02 DIAGNOSIS — Z6825 Body mass index (BMI) 25.0-25.9, adult: Secondary | ICD-10-CM | POA: Diagnosis not present

## 2024-04-02 DIAGNOSIS — Z01419 Encounter for gynecological examination (general) (routine) without abnormal findings: Secondary | ICD-10-CM | POA: Diagnosis not present

## 2024-05-03 ENCOUNTER — Encounter: Payer: Self-pay | Admitting: Physician Assistant

## 2024-05-06 ENCOUNTER — Other Ambulatory Visit: Payer: Self-pay | Admitting: Physician Assistant

## 2024-05-06 DIAGNOSIS — R519 Headache, unspecified: Secondary | ICD-10-CM

## 2024-05-16 ENCOUNTER — Ambulatory Visit: Admitting: Family Medicine

## 2024-05-28 ENCOUNTER — Ambulatory Visit
Admission: RE | Admit: 2024-05-28 | Discharge: 2024-05-28 | Disposition: A | Source: Ambulatory Visit | Attending: Physician Assistant | Admitting: Physician Assistant

## 2024-05-28 DIAGNOSIS — R519 Headache, unspecified: Secondary | ICD-10-CM

## 2024-08-27 ENCOUNTER — Ambulatory Visit: Admitting: Family Medicine
